# Patient Record
Sex: Female | Born: 1980 | Hispanic: Yes | Marital: Married | Smoking: Never smoker
Health system: Southern US, Community
[De-identification: ages and names within clinical notes are randomized; demographics above are authoritative.]

---

## 2009-10-15 HISTORY — PX: BREAST ENHANCEMENT SURGERY: SHX7

## 2013-08-28 ENCOUNTER — Ambulatory Visit (INDEPENDENT_AMBULATORY_CARE_PROVIDER_SITE_OTHER): Payer: No Typology Code available for payment source | Admitting: *Deleted

## 2013-08-28 ENCOUNTER — Encounter: Payer: Self-pay | Admitting: *Deleted

## 2013-08-28 DIAGNOSIS — Z3201 Encounter for pregnancy test, result positive: Secondary | ICD-10-CM

## 2013-08-28 DIAGNOSIS — Z3401 Encounter for supervision of normal first pregnancy, first trimester: Secondary | ICD-10-CM

## 2013-08-28 LAB — HIV ANTIBODY (ROUTINE TESTING W REFLEX): HIV: NONREACTIVE

## 2013-08-28 LAB — POCT PREGNANCY, URINE: Preg Test, Ur: POSITIVE — AB

## 2013-08-29 LAB — OBSTETRIC PANEL
Antibody Screen: NEGATIVE
Basophils Relative: 0 % (ref 0–1)
Eosinophils Absolute: 0.1 10*3/uL (ref 0.0–0.7)
HCT: 38.2 % (ref 36.0–46.0)
Hemoglobin: 13 g/dL (ref 12.0–15.0)
Lymphocytes Relative: 23 % (ref 12–46)
Lymphs Abs: 1.5 10*3/uL (ref 0.7–4.0)
MCV: 85.3 fL (ref 78.0–100.0)
Monocytes Absolute: 0.5 10*3/uL (ref 0.1–1.0)
Monocytes Relative: 7 % (ref 3–12)
Neutro Abs: 4.6 10*3/uL (ref 1.7–7.7)
Rh Type: POSITIVE
Rubella: 6.31 Index — ABNORMAL HIGH (ref <0.90)
WBC: 6.7 10*3/uL (ref 4.0–10.5)

## 2013-09-01 LAB — HEMOGLOBINOPATHY EVALUATION: Hgb F Quant: 0 % (ref 0.0–2.0)

## 2013-09-23 ENCOUNTER — Ambulatory Visit (INDEPENDENT_AMBULATORY_CARE_PROVIDER_SITE_OTHER): Payer: No Typology Code available for payment source | Admitting: Family

## 2013-09-23 ENCOUNTER — Encounter: Payer: Self-pay | Admitting: Family

## 2013-09-23 VITALS — BP 103/71 | Temp 97.1°F | Ht 65.0 in

## 2013-09-23 DIAGNOSIS — Z23 Encounter for immunization: Secondary | ICD-10-CM

## 2013-09-23 DIAGNOSIS — Z3490 Encounter for supervision of normal pregnancy, unspecified, unspecified trimester: Secondary | ICD-10-CM | POA: Insufficient documentation

## 2013-09-23 DIAGNOSIS — Z3480 Encounter for supervision of other normal pregnancy, unspecified trimester: Secondary | ICD-10-CM

## 2013-09-23 LAB — POCT URINALYSIS DIP (DEVICE)
Bilirubin Urine: NEGATIVE
Glucose, UA: NEGATIVE mg/dL
Ketones, ur: NEGATIVE mg/dL
Nitrite: NEGATIVE
Specific Gravity, Urine: 1.02 (ref 1.005–1.030)
Urobilinogen, UA: 0.2 mg/dL (ref 0.0–1.0)
pH: 6 (ref 5.0–8.0)

## 2013-09-23 LAB — OB RESULTS CONSOLE GC/CHLAMYDIA
CHLAMYDIA, DNA PROBE: NEGATIVE
GC PROBE AMP, GENITAL: NEGATIVE

## 2013-09-23 NOTE — Progress Notes (Signed)
   Subjective:    Nicole Rivas is a G1P0 [redacted]w[redacted]d being seen today for her first obstetrical visit.  Her obstetrical history is insignificant.   Patient does intend to breast feed. Pregnancy history fully reviewed.  Patient reports nausea, no bleeding and no cramping.  Filed Vitals:   09/23/13 0832 09/23/13 0834  BP: 103/71   Temp: 97.1 F (36.2 C)   Height:  5\' 5"  (1.651 m)    HISTORY: OB History  Gravida Para Term Preterm AB SAB TAB Ectopic Multiple Living  1             # Outcome Date GA Lbr Len/2nd Weight Sex Delivery Anes PTL Lv  1 CUR              History reviewed. No pertinent past medical history. Past Surgical History  Procedure Laterality Date  . Breast enhancement surgery  2011   History reviewed. No pertinent family history.   Exam    Exam   BP 103/71  Temp(Src) 97.1 F (36.2 C)  Ht 5\' 5"  (1.651 m)  LMP 06/25/2013 Uterine Size: size is slightly larger than dates  Pelvic Exam:    Perineum: No Hemorrhoids, Normal Perineum   Vulva: normal   Vagina:  normal mucosa, normal discharge, no palpable nodules   pH: Not done   Cervix: no bleeding following Pap, no cervical motion tenderness and no lesions   Adnexa: normal adnexa and no mass, fullness, tenderness   Bony Pelvis: Adequate  System: Breast:  No nipple retraction or dimpling, No nipple discharge or bleeding, No axillary or supraclavicular adenopathy, Normal to palpation without dominant masses   Skin: normal coloration and turgor, no rashes    Neurologic: negative   Extremities: normal strength, tone, and muscle mass   HEENT neck supple with midline trachea and thyroid without masses   Mouth/Teeth mucous membranes moist, pharynx normal without lesions   Neck supple and no masses   Cardiovascular: regular rate and rhythm, no murmurs or gallops   Respiratory:  appears well, vitals normal, no respiratory distress, acyanotic, normal RR, neck free of mass or lymphadenopathy, chest clear, no wheezing,  crepitations, rhonchi, normal symmetric air entry   Abdomen: soft, non-tender; bowel sounds normal; no masses,  no organomegaly   Urinary: urethral meatus normal      Assessment:    Pregnancy: G1P0 There are no active problems to display for this patient.       Plan:     Reviewed initial prenatal labs results. Prenatal vitamins - continue. Problem list reviewed and updated. Flu vaccine today.   Genetic Screening discussed First Screen: ordered.  Ultrasound discussed; fetal survey: order in 5 weeks..  Follow up in 4 weeks.    [facility] 09/23/2013

## 2013-09-23 NOTE — Progress Notes (Signed)
Pulse- 66 New ob packet given  Weight gain of 25-35lbs Flu vaccine consented and info given

## 2013-09-23 NOTE — Patient Instructions (Signed)
Second Trimester of Pregnancy The second trimester is from week 13 through week 28, months 4 through 6. The second trimester is often a time when you feel your best. Your body has also adjusted to being pregnant, and you begin to feel better physically. Usually, morning sickness has lessened or quit completely, you may have more energy, and you may have an increase in appetite. The second trimester is also a time when the fetus is growing rapidly. At the end of the sixth month, the fetus is about 9 inches long and weighs about 1 pounds. You will likely begin to feel the baby move (quickening) between 18 and 20 weeks of the pregnancy. BODY CHANGES Your body goes through many changes during pregnancy. The changes vary from woman to woman.   Your weight will continue to increase. You will notice your lower abdomen bulging out.  You may begin to get stretch marks on your hips, abdomen, and breasts.  You may develop headaches that can be relieved by medicines approved by your caregiver.  You may urinate more often because the fetus is pressing on your bladder.  You may develop or continue to have heartburn as a result of your pregnancy.  You may develop constipation because certain hormones are causing the muscles that push waste through your intestines to slow down.  You may develop hemorrhoids or swollen, bulging veins (varicose veins).  You may have back pain because of the weight gain and pregnancy hormones relaxing your joints between the bones in your pelvis and as a result of a shift in weight and the muscles that support your balance.  Your breasts will continue to grow and be tender.  Your gums may bleed and may be sensitive to brushing and flossing.  Dark spots or blotches (chloasma, mask of pregnancy) may develop on your face. This will likely fade after the baby is born.  A dark line from your belly button to the pubic area (linea nigra) may appear. This will likely fade after the  baby is born. WHAT TO EXPECT AT YOUR PRENATAL VISITS During a routine prenatal visit:  You will be weighed to make sure you and the fetus are growing normally.  Your blood pressure will be taken.  Your abdomen will be measured to track your baby's growth.  The fetal heartbeat will be listened to.  Any test results from the previous visit will be discussed. Your caregiver may ask you:  How you are feeling.  If you are feeling the baby move.  If you have had any abnormal symptoms, such as leaking fluid, bleeding, severe headaches, or abdominal cramping.  If you have any questions. Other tests that may be performed during your second trimester include:  Blood tests that check for:  Low iron levels (anemia).  Gestational diabetes (between 24 and 28 weeks).  Rh antibodies.  Urine tests to check for infections, diabetes, or protein in the urine.  An ultrasound to confirm the proper growth and development of the baby.  An amniocentesis to check for possible genetic problems.  Fetal screens for spina bifida and Down syndrome. HOME CARE INSTRUCTIONS   Avoid all smoking, herbs, alcohol, and unprescribed drugs. These chemicals affect the formation and growth of the baby.  Follow your caregiver's instructions regarding medicine use. There are medicines that are either safe or unsafe to take during pregnancy.  Exercise only as directed by your caregiver. Experiencing uterine cramps is a good sign to stop exercising.  Continue to eat regular,   healthy meals.  Wear a good support bra for breast tenderness.  Do not use hot tubs, steam rooms, or saunas.  Wear your seat belt at all times when driving.  Avoid raw meat, uncooked cheese, cat litter boxes, and soil used by cats. These carry germs that can cause birth defects in the baby.  Take your prenatal vitamins.  Try taking a stool softener (if your caregiver approves) if you develop constipation. Eat more high-fiber foods,  such as fresh vegetables or fruit and whole grains. Drink plenty of fluids to keep your urine clear or pale yellow.  Take warm sitz baths to soothe any pain or discomfort caused by hemorrhoids. Use hemorrhoid cream if your caregiver approves.  If you develop varicose veins, wear support hose. Elevate your feet for 15 minutes, 3 4 times a day. Limit salt in your diet.  Avoid heavy lifting, wear low heel shoes, and practice good posture.  Rest with your legs elevated if you have leg cramps or low back pain.  Visit your dentist if you have not gone yet during your pregnancy. Use a soft toothbrush to brush your teeth and be gentle when you floss.  A sexual relationship may be continued unless your caregiver directs you otherwise.  Continue to go to all your prenatal visits as directed by your caregiver. SEEK MEDICAL CARE IF:   You have dizziness.  You have mild pelvic cramps, pelvic pressure, or nagging pain in the abdominal area.  You have persistent nausea, vomiting, or diarrhea.  You have a bad smelling vaginal discharge.  You have pain with urination. SEEK IMMEDIATE MEDICAL CARE IF:   You have a fever.  You are leaking fluid from your vagina.  You have spotting or bleeding from your vagina.  You have severe abdominal cramping or pain.  You have rapid weight gain or loss.  You have shortness of breath with chest pain.  You notice sudden or extreme swelling of your face, hands, ankles, feet, or legs.  You have not felt your baby move in over an hour.  You have severe headaches that do not go away with medicine.  You have vision changes. Document Released: 09/25/2001 Document Revised: 06/03/2013 Document Reviewed: 12/02/2012 ExitCare Patient Information 2014 Exit[employer], [employer].  

## 2013-09-23 NOTE — Addendum Note (Signed)
Addended by: Sherre Lain A on: 09/23/2013 10:36 AM   Modules accepted: Orders

## 2013-09-24 LAB — PRESCRIPTION MONITORING PROFILE (19 PANEL)
Amphetamine/Meth: NEGATIVE ng/mL
Buprenorphine, Urine: NEGATIVE ng/mL
Cannabinoid Scrn, Ur: NEGATIVE ng/mL
Carisoprodol, Urine: NEGATIVE ng/mL
Creatinine, Urine: 112.28 mg/dL (ref >20.0)
Fentanyl, Ur: NEGATIVE ng/mL
MDMA URINE: NEGATIVE ng/mL
Methadone Screen, Urine: NEGATIVE ng/mL
Oxycodone Screen, Ur: NEGATIVE ng/mL
Phencyclidine, Ur: NEGATIVE ng/mL
Tapentadol, urine: NEGATIVE ng/mL
pH, Initial: 5.9 pH (ref 4.5–8.9)

## 2013-09-25 ENCOUNTER — Encounter: Payer: Self-pay | Admitting: Family

## 2013-09-25 LAB — CULTURE, OB URINE: Organism ID, Bacteria: NO GROWTH

## 2013-09-28 ENCOUNTER — Encounter: Payer: Self-pay | Admitting: Family

## 2013-10-15 NOTE — L&D Delivery Note (Signed)
Delivery Note At 6:05 AM a viable female was delivered via Vaginal, Spontaneous Delivery (Presentation: ; Occiput Anterior).  APGAR: 9, 9; weight pending.   Placenta status: Intact, Spontaneous.  Cord: 3 vessels with the following complications: None.    Anesthesia: Epidural  Episiotomy: None Lacerations: 2nd degree;Perineal Suture Repair: 2.0 vicryl Est. Blood Loss (mL): 300  Mom to postpartum.  Baby to Couplet care / Skin to Skin.  CRESENZO-DISHMAN,FRANCES 04/09/2014, 8:00 AM   Pt pushed about 20 minutes and delivered without complications.  Delayed cord clamping for 3 minutes.  3rd stage actively managed with Pitocin IV, CCT, and maternal pushing effort.

## 2013-10-21 ENCOUNTER — Ambulatory Visit (INDEPENDENT_AMBULATORY_CARE_PROVIDER_SITE_OTHER): Payer: Medicaid Other | Admitting: Family Medicine

## 2013-10-21 ENCOUNTER — Encounter: Payer: Self-pay | Admitting: Family Medicine

## 2013-10-21 VITALS — BP 102/65 | Temp 99.1°F

## 2013-10-21 DIAGNOSIS — Z3480 Encounter for supervision of other normal pregnancy, unspecified trimester: Secondary | ICD-10-CM

## 2013-10-21 DIAGNOSIS — Z3491 Encounter for supervision of normal pregnancy, unspecified, first trimester: Secondary | ICD-10-CM

## 2013-10-21 LAB — POCT URINALYSIS DIP (DEVICE)
Bilirubin Urine: NEGATIVE
Glucose, UA: NEGATIVE mg/dL
HGB URINE DIPSTICK: NEGATIVE
Ketones, ur: NEGATIVE mg/dL
Leukocytes, UA: NEGATIVE
NITRITE: NEGATIVE
PH: 6 (ref 5.0–8.0)
Protein, ur: NEGATIVE mg/dL
Specific Gravity, Urine: 1.025 (ref 1.005–1.030)
Urobilinogen, UA: 0.2 mg/dL (ref 0.0–1.0)

## 2013-10-21 NOTE — Patient Instructions (Signed)
secSegundo trimestre del Psychiatristembarazo  (Second Trimester of Pregnancy) El segundo trimestre del Psychiatristembarazo se extiende desde la semana 13 hasta la semana 28, del 4 al 6 mes. En general, es el momento del embarazo en el que se sentir mejor. Su organismo se ha adaptado a Charity fundraiserestar embarazada y comienza a Diplomatic Services operational officersentirse fsicamente mejor. En general las nuseas matutinas han disminuido o han desaparecido completamente. El segundo trimestre es tambin la poca en la que el feto se desarrolla rpidamente. Hacia el final del sexto mes, el beb mide aproximadamente 9 pulgadas (23 cm) y pesa alrededor de 1  libras (700 g). Es probable que Architectural technologistsienta mover al beb (dar pataditas) entre las 18 y 20 semanas del Psychiatristembarazo.  CAMBIOS CORPORALES  Su organismo atravesar numerosos cambios durante el [place]barazo. Los cambios varan de una mujer a Educational psychologistotra.   Seguir American Standard Companiesaumentando de peso. Notar que la parte baja del abdomen se ensancha.  Podrn aparecer las primeras estras en las caderas, abdomen y [place]amas.  Es posible que sienta cefaleas, que se pueden Paramedicaliviar con los medicamentos que su mdico le autorice a Chemical engineerutilizar.  Tendr necesidad de orinar con ms frecuencia porque el feto est presionando en la vejiga.  Como consecuencia del Psychiatristembarazo, podr sentir Actoracidez estomacal continuamente.  Podr estar constipada ya que ciertas hormonas hacen que los msculos que New York Life Insuranceempujan los desechos a travs de los intestinos trabajen ms lentamente.  Pueden aparecer hemorroides o abultarse las venas (venas varicosas).  El dolor de espalda se debe al aumento de peso y a que las hormonas del Management consultantembarazo relajan las articulaciones entre los huesos de la pelvis y a la modificacin del peso y a los msculos que soportan el equilibrio.  Sus mamas seguirn desarrollndose y estarn ms sensibles.  Las Veterinary surgeonencas pueden sangrar y estar sensibles al cepillado y al hilo dental.  Pueden aparecer en el rostro zonas oscuras o manchas ([place]cloasma, mscara del [place]jeskaembarazo). Esto  desaparece despus del nacimiento del beb.  Es posible que se formeuna lnea oscura desde el ombligo a la zona del pubis (linea nigra). Esto desaparece despus del nacimiento del beb. QU DEBE ESPERAR EN LAS CONSULTAS PRENATALES  Durante una visita prenatal de rutina:   La pesarn para verificar que usted y el feto se encuentran dentro de los lmites normales.  Le tomarn la presin arterial.  Le medirn el abdomen para verificar el desarrollo del beb.  Escucharn los latidos fetales.  Se evaluarn los resultados de los estudios realizados en visitas anteriores. El mdico le preguntar:   Cmo se siente.  Si siente los movimientos del beb.  Si tiene sntomas anormales, como prdida de lquido, Hewittsangrado, dolores de cabeza intensos o clicos abdominales.  Si tiene [place]una duda. Otros estudios que podrn realizarse durante el segundo trimestre son:   Anlisis de sangre para evaluar:  Niveles bajos de hierro (anemia).  Diabetes gestacional (entre las 24 y las 28 [address]).  Anticuerpos Rh.  Anlisis de orina para detectar infecciones, diabetes o protenas en la orina.  Una ecografa para confirmar si el crecimiento y el desarrollo del beb son los Bertsch-Oceanviewadecuados.  Una amniocentesis para diagnosticar posibles problemas genticos.  Estudios del feto para descartar espina bfida y sndrome de Down. INSTRUCCIONES PARA EL CUIDADO EN EL HOGAR   Evite fumar, consumir hierbas, beber alcohol y Chemical engineerutilizar frmacos que no ne hayan recetado. Estas sustancias qumicas afectan la formacin y el desarrollo del beb.  Siga las indicaciones del profesional con respecto a Adult nursecomo tomar los medicamentos. Durante el embarazo, hay  medicamentos que son seguros y otros no lo son.  Realice actividad fsica slo segn las indicaciones del mdico. Sentir clicos uterinos es el mejor signo para detener la actividad fsica.  Contine haciendo comidas regulares y sanas.  Use un sostn que le brinde buen  soporte si sus mamas estn sensibles.  No utilice la baera con agua caliente, baos turcos y saunas.  Colquese el cinturn de seguridad cuando conduzca.  Evite comer carne cruda y el contacto con los utensilios y desperdicios de los gatos. Estos elementos contienen grmenes que pueden causar defectos de nacimiento en el beb.  Tome las vitaminas indicadas para la etapa prenatal.  Pruebe un laxante (si el mdico la autoriza) si tiene constipacin. Consuma ms alimentos ricos en fibra, como vegetales y frutas frescos y cereales enteros. Beba gran cantidad de lquido para mantener la orina de tono claro o amarillo plido.  Tome baos de agua tibia para calmar el dolor o las molestias causadas por las hemorroides. Use una crema para las hemorroides si el mdico la autoriza.  Si tiene venas varicosas, use medias de soporte. Eleve los pies durante 15 minutos, 3 o 4 veces por da. Limite el consumo de sal en su dieta.  Evite levantar objetos pesados, use zapatos de tacones bajos y mantenga una buena postura.  Descanse con las piernas elevadas si tiene calambres o dolor de cintura.  Visite a su dentista si no lo ha hecho durante el embarazo. Use un cepillo de dientes blando para higienizarse los dientes y use suavemente el hilo dental.  Puede continuar su vida sexual siempre que el mdico la autorice.  Concurra a todas las visitas prenatales segn las indicaciones de su mdico. SOLICITE ATENCIN MDICA SI:   Tiene mareos.  Siente clicos leves, presin en la pelvis o dolor persistente en el abdomen.  Tiene nuseas o vmitos o diarrea persistentes.  Observa una secrecin vaginal con mal olor.  Siente dolor al orinar. SOLICITE ATENCIN MDICA DE INMEDIATO SI:   Tiene fiebre.  Pierde lquido por la vagina.  Tiene sangrando o pequeas prdidas vaginales.  Siente dolor intenso o clicos en el abdomen.  Sube o baja de peso rpidamente.  Tiene dificultad para respirar y le duele  pecho.  Sbitamente se le hincha el rostro, las manos, los tobillos, los pies o las piernas de manera extrema.  No ha sentido los movimientos del beb durante una hora.  Siente un dolor de cabeza intenso que no se alivia con medicamentos.  Su visin se modifica. Document Released: 07/11/2005 Document Revised: 06/03/2013 ExitCare Patient Information 2014 ExitCare, LLC.  

## 2013-10-21 NOTE — Progress Notes (Signed)
No FM, no LOF, no VB, no Ctx No complaints.  Nicole ShieldsDanae Rivas is a 33 y.o. G1P0 at [id number] by LMP presents for ROB  Discussed with Patient:  - quad screen today - Patient plans on breast feeding. - Routine precautions discussed (depression, infection s/s).   Patient provided with all pertinent phone numbers for emergencies. - RTC for any VB, regular, painful cramps/ctxs occurring at a rate of >2/10 min, fever (100.5 or higher), n/v/d, any pain that is unresolving or worsening. - RTC in 4 weeks for next appt.  Problems: Patient Active Problem List   Diagnosis Date Noted  . Supervision of normal pregnancy 09/23/2013    To Do: 1. 18 week anatomy scan ordered.  Patient to schedule.  [ ]  Vaccines: Flu:  Tdap:  [ ]  BCM:   Edu: [ ]  PTL precautions; [ ]  BF class; [ ]  childbirth class; [ ]   BF counseling

## 2013-10-21 NOTE — Progress Notes (Signed)
P=68, 

## 2013-10-23 LAB — AFP, QUAD SCREEN
AFP: 19.6 [IU]/mL
Age Alone: 1:540 {titer}
Curr Gest Age: 16.6 wks.days
Down Syndrome Scr Risk Est: 1:20 {titer}
HCG, Total: 46443 m[IU]/mL
INH: 293.3 pg/mL
Interpretation-AFP: POSITIVE — AB
MOM FOR HCG: 2.33
MoM for AFP: 0.53
MoM for INH: 1.76
OPEN SPINA BIFIDA: NEGATIVE
Osb Risk: <1:27300 {titer}
TRI 18 SCR RISK EST: NEGATIVE
UE3 VALUE: 0.5 ng/mL
uE3 Mom: 0.64

## 2013-10-28 ENCOUNTER — Encounter: Payer: Self-pay | Admitting: Family Medicine

## 2013-10-28 ENCOUNTER — Telehealth: Payer: Self-pay | Admitting: Family Medicine

## 2013-10-28 DIAGNOSIS — O280 Abnormal hematological finding on antenatal screening of mother: Secondary | ICD-10-CM

## 2013-10-28 NOTE — Telephone Encounter (Signed)
+   quad screen 1:20 downs. Refer to MFM. Discussed with nursing.

## 2013-10-28 NOTE — Telephone Encounter (Signed)
Called MFM and made appointment for genetic counseling for 10/30/13 at 10 am. They will discuss results and offer additional testing.  Called Nicole Rivas and left a message we are calling with some important information- please call clinic today or tomorrow.

## 2013-10-29 NOTE — Telephone Encounter (Signed)
Called patient and message stated this person is not receiving incoming calls per their request. Will send patient mychart message.

## 2013-10-29 NOTE — Telephone Encounter (Signed)
[facility] heard busy signal.

## 2013-10-29 NOTE — Telephone Encounter (Signed)
Called Nicole Rivas's number again, unable to leave a message. Called her contact person ( mother) who [place]- and will not be back until 11/17/13.  Will give her message to call us.  Already has obfu and ultrasound scheduled on 11/18/13

## 2013-10-29 NOTE — Telephone Encounter (Signed)
Will forward to Dr. Ike Benedom

## 2013-10-30 ENCOUNTER — Ambulatory Visit (HOSPITAL_COMMUNITY): Payer: No Typology Code available for payment source | Attending: Family Medicine

## 2013-11-18 ENCOUNTER — Ambulatory Visit (HOSPITAL_COMMUNITY)
Admission: RE | Admit: 2013-11-18 | Discharge: 2013-11-18 | Disposition: A | Discharge status: Home / Self Care | Payer: No Typology Code available for payment source | Source: Ambulatory Visit | Attending: Family Medicine | Admitting: Family Medicine

## 2013-11-18 ENCOUNTER — Other Ambulatory Visit: Payer: Self-pay | Admitting: Family Medicine

## 2013-11-18 ENCOUNTER — Encounter: Payer: Self-pay | Admitting: Obstetrics and Gynecology

## 2013-11-18 ENCOUNTER — Other Ambulatory Visit: Payer: Self-pay

## 2013-11-18 ENCOUNTER — Ambulatory Visit (INDEPENDENT_AMBULATORY_CARE_PROVIDER_SITE_OTHER): Payer: No Typology Code available for payment source | Admitting: Obstetrics and Gynecology

## 2013-11-18 VITALS — BP 109/71 | HR 79 | Wt 138.0 lb

## 2013-11-18 VITALS — BP 107/69 | Temp 97.6°F | Wt 136.7 lb

## 2013-11-18 DIAGNOSIS — O289 Unspecified abnormal findings on antenatal screening of mother: Secondary | ICD-10-CM | POA: Insufficient documentation

## 2013-11-18 DIAGNOSIS — Z3490 Encounter for supervision of normal pregnancy, unspecified, unspecified trimester: Secondary | ICD-10-CM

## 2013-11-18 DIAGNOSIS — O280 Abnormal hematological finding on antenatal screening of mother: Secondary | ICD-10-CM

## 2013-11-18 DIAGNOSIS — Z363 Encounter for antenatal screening for malformations: Secondary | ICD-10-CM | POA: Insufficient documentation

## 2013-11-18 DIAGNOSIS — Z3491 Encounter for supervision of normal pregnancy, unspecified, first trimester: Secondary | ICD-10-CM

## 2013-11-18 DIAGNOSIS — Z1389 Encounter for screening for other disorder: Secondary | ICD-10-CM | POA: Insufficient documentation

## 2013-11-18 DIAGNOSIS — Z3689 Encounter for other specified antenatal screening: Secondary | ICD-10-CM

## 2013-11-18 DIAGNOSIS — O358XX0 Maternal care for other (suspected) fetal abnormality and damage, not applicable or unspecified: Secondary | ICD-10-CM | POA: Insufficient documentation

## 2013-11-18 DIAGNOSIS — Z3480 Encounter for supervision of other normal pregnancy, unspecified trimester: Secondary | ICD-10-CM

## 2013-11-18 LAB — POCT URINALYSIS DIP (DEVICE)
BILIRUBIN URINE: NEGATIVE
Glucose, UA: NEGATIVE mg/dL
HGB URINE DIPSTICK: NEGATIVE
KETONES UR: NEGATIVE mg/dL
Nitrite: NEGATIVE
PH: 6 (ref 5.0–8.0)
PROTEIN: NEGATIVE mg/dL
SPECIFIC GRAVITY, URINE: 1.015 (ref 1.005–1.030)
Urobilinogen, UA: 0.2 mg/dL (ref 0.0–1.0)

## 2013-11-18 NOTE — Progress Notes (Signed)
p=68

## 2013-11-18 NOTE — Progress Notes (Signed)
Anatomic US done, report pending.  Doing well. No concerns. Works as Diplomatic Services operational officersecretary.

## 2013-11-19 ENCOUNTER — Encounter: Payer: Self-pay | Admitting: Family Medicine

## 2013-11-19 DIAGNOSIS — O289 Unspecified abnormal findings on antenatal screening of mother: Secondary | ICD-10-CM | POA: Insufficient documentation

## 2013-11-27 ENCOUNTER — Telehealth (HOSPITAL_COMMUNITY): Payer: Self-pay | Admitting: Maternal and Fetal Medicine

## 2013-11-27 NOTE — Telephone Encounter (Signed)
Called Nicole Rivas to review the results of her Panorama.  There was no answer and no voicemail.

## 2013-12-16 ENCOUNTER — Ambulatory Visit (INDEPENDENT_AMBULATORY_CARE_PROVIDER_SITE_OTHER): Payer: Medicaid Other | Admitting: Obstetrics and Gynecology

## 2013-12-16 VITALS — BP 107/70 | Temp 98.2°F | Wt 144.6 lb

## 2013-12-16 DIAGNOSIS — Z3480 Encounter for supervision of other normal pregnancy, unspecified trimester: Secondary | ICD-10-CM

## 2013-12-16 DIAGNOSIS — O289 Unspecified abnormal findings on antenatal screening of mother: Secondary | ICD-10-CM

## 2013-12-16 DIAGNOSIS — Z3490 Encounter for supervision of normal pregnancy, unspecified, unspecified trimester: Secondary | ICD-10-CM

## 2013-12-16 LAB — POCT URINALYSIS DIP (DEVICE)
Bilirubin Urine: NEGATIVE
Glucose, UA: NEGATIVE mg/dL
HGB URINE DIPSTICK: NEGATIVE
Ketones, ur: NEGATIVE mg/dL
Leukocytes, UA: NEGATIVE
Nitrite: NEGATIVE
PROTEIN: NEGATIVE mg/dL
Specific Gravity, Urine: 1.025 (ref 1.005–1.030)
UROBILINOGEN UA: 0.2 mg/dL (ref 0.0–1.0)
pH: 6.5 (ref 5.0–8.0)

## 2013-12-16 NOTE — Patient Instructions (Signed)
Hemorragia vaginal durante el embarazo (segundo trimestre)  (Vaginal Bleeding During Pregnancy, Second Trimester)  Durante el embarazo es relativamente frecuente que se presente una pequeña hemorragia (manchas). Esta situación generalmente mejora por sí misma. Hay diversos factores que pueden causar hemorragia o manchas en el embarazo. Algunas manchas pueden estar relacionadas al embarazo y otras no. A veces, la hemorragia es normal y no es un problema. Sin embargo, la hemorragia también puede ser un signo de algo grave. Debe informar a su médico de inmediato si tiene alguna hemorragia vaginal.  Algunas causas posibles de hemorragia vaginal durante el segundo trimestre incluyen:  · Infección, inflamación o crecimientos anormales en el cuello del útero.  · La placenta cubre parcial o completamente la abertura del cuello del útero (placenta previa).  · La placenta puede haberse separado del útero (abrupción placentaria).  · Usted puede estar sufriendo un trabajo de parto prematuro.  · Es probable que el cuello del útero no sea lo suficientemente resistente para mantener un bebé dentro del útero (insuficiencia cervical).  · Se pueden haber desarrollado pequeños quistes en el útero en lugar de tejido de embarazo (embarazo molar).  INSTRUCCIONES PARA EL CUIDADO EN EL HOGAR   Controle su afección para ver si hay cambios. Las siguientes indicaciones ayudarán a aliviar cualquier molestia que pueda sentir:  · Siga las indicaciones del médico para restringir su actividad. Si el médico le indica descanso en la cama, debe quedarse en la cama y levantarse solo para ir al baño. No obstante, el médico puede permitirle que continué con tareas livianas.  · Si es necesario, organícese para que alguien le ayude con las actividades y responsabilidades cotidianas mientras está en cama.  · Lleve un registro de la cantidad y la saturación de las toallas higiénicas que utiliza cada día. Anote este dato.  · No use tampones.No se haga duchas  vaginales.  · No tenga relaciones sexuales u orgasmos hasta que el médico la autorice.  · Si elimina tejido por la vagina, guárdelo para mostrárselo al médico.  · Tome solo medicamentos de venta libre o recetados, según las indicaciones del médico.  · No tome aspirina, ya que puede causar hemorragias.  · No realice ejercicio ni ninguna actividad intensa, ni levante objetos pesados sin el permiso de su médico.  · Cumpla con todas las visitas de control, según le indique su médico.  SOLICITE ATENCIÓN MÉDICA SI:  · Tiene un sangrado vaginal en cualquier momento del embarazo.  · Tiene calambres o dolores de parto.  SOLICITE ATENCIÓN MÉDICA DE INMEDIATO SI:   · Siente calambres intensos en la espalda o en el vientre (abdomen).  · Siente contracciones.  · Tiene fiebre que los medicamentos no pueden controlar.  · Tiene escalofríos.  · Elimina coágulos grandes o tejido por la vagina.  · La hemorragia aumenta.  · Si se siente mareada, débil o se desmaya.  · Tiene una pérdida importante o sale líquido a borbotones por la vagina.  ASEGÚRESE DE QUE:  · Comprende estas instrucciones.  · Controlará su afección.  · Recibirá ayuda de inmediato si no mejora o si empeora.  Document Released: 07/11/2005 Document Revised: 07/22/2013  ExitCare® Patient Information ©2014 ExitCare, LLC.

## 2013-12-16 NOTE — Progress Notes (Signed)
P= 74 Panorama and US results reviewed- low risk and normal anatomy. Encouraged to do childbirth classes, breastfeeding class.

## 2014-01-13 ENCOUNTER — Ambulatory Visit (INDEPENDENT_AMBULATORY_CARE_PROVIDER_SITE_OTHER): Payer: Medicaid Other | Admitting: Advanced Practice Midwife

## 2014-01-13 ENCOUNTER — Encounter: Payer: Self-pay | Admitting: Advanced Practice Midwife

## 2014-01-13 VITALS — BP 97/67 | Temp 97.4°F | Wt 146.9 lb

## 2014-01-13 DIAGNOSIS — O289 Unspecified abnormal findings on antenatal screening of mother: Secondary | ICD-10-CM

## 2014-01-13 DIAGNOSIS — Z3480 Encounter for supervision of other normal pregnancy, unspecified trimester: Secondary | ICD-10-CM

## 2014-01-13 DIAGNOSIS — Z3490 Encounter for supervision of normal pregnancy, unspecified, unspecified trimester: Secondary | ICD-10-CM

## 2014-01-13 LAB — POCT URINALYSIS DIP (DEVICE)
BILIRUBIN URINE: NEGATIVE
GLUCOSE, UA: NEGATIVE mg/dL
HGB URINE DIPSTICK: NEGATIVE
KETONES UR: NEGATIVE mg/dL
Nitrite: NEGATIVE
Protein, ur: 30 mg/dL — AB
SPECIFIC GRAVITY, URINE: 1.025 (ref 1.005–1.030)
UROBILINOGEN UA: 0.2 mg/dL (ref 0.0–1.0)
pH: 6.5 (ref 5.0–8.0)

## 2014-01-13 LAB — RPR

## 2014-01-13 LAB — CBC
HEMATOCRIT: 34.7 % — AB (ref 36.0–46.0)
Hemoglobin: 11.8 g/dL — ABNORMAL LOW (ref 12.0–15.0)
MCH: 29.8 pg (ref 26.0–34.0)
MCHC: 34 g/dL (ref 30.0–36.0)
MCV: 87.6 fL (ref 78.0–100.0)
Platelets: 234 10*3/uL (ref 150–400)
RBC: 3.96 MIL/uL (ref 3.87–5.11)
RDW: 13.1 % (ref 11.5–15.5)
WBC: 8.6 10*3/uL (ref 4.0–10.5)

## 2014-01-13 LAB — HIV ANTIBODY (ROUTINE TESTING W REFLEX): HIV: NONREACTIVE

## 2014-01-13 MED ORDER — TETANUS-DIPHTH-ACELL PERTUSSIS 5-2.5-18.5 LF-MCG/0.5 IM SUSP: 0.5000 mL | Freq: Once | INTRAMUSCULAR | Status: DC

## 2014-01-13 NOTE — Patient Instructions (Signed)
Third Trimester of Pregnancy  The third trimester is from week 29 through week 42, months 7 through 9. The third trimester is a time when the fetus is growing rapidly. At the end of the ninth month, the fetus is about 20 inches in length and weighs 6 10 pounds.   BODY CHANGES  Your body goes through many changes during pregnancy. The changes vary from woman to woman.    Your weight will continue to increase. You can expect to gain 25 35 pounds (11 16 kg) by the end of the pregnancy.   You may begin to get stretch marks on your hips, abdomen, and breasts.   You may urinate more often because the fetus is moving lower into your pelvis and pressing on your bladder.   You may develop or continue to have heartburn as a result of your pregnancy.   You may develop constipation because certain hormones are causing the muscles that push waste through your intestines to slow down.   You may develop hemorrhoids or swollen, bulging veins (varicose veins).   You may have pelvic pain because of the weight gain and pregnancy hormones relaxing your joints between the bones in your pelvis. Back aches may result from over exertion of the muscles supporting your posture.   Your breasts will continue to grow and be tender. A yellow discharge may leak from your breasts called colostrum.   Your belly button may stick out.   You may feel short of breath because of your expanding uterus.   You may notice the fetus "dropping," or moving lower in your abdomen.   You may have a bloody mucus discharge. This usually occurs a few days to a week before labor begins.   Your cervix becomes thin and soft (effaced) near your due date.  WHAT TO EXPECT AT YOUR PRENATAL EXAMS   You will have prenatal exams every 2 weeks until week 36. Then, you will have weekly prenatal exams. During a routine prenatal visit:   You will be weighed to make sure you and the fetus are growing normally.   Your blood pressure is taken.   Your abdomen will be  measured to track your baby's growth.   The fetal heartbeat will be listened to.   Any test results from the previous visit will be discussed.   You may have a cervical check near your due date to see if you have effaced.  At around 36 weeks, your caregiver will check your cervix. At the same time, your caregiver will also perform a test on the secretions of the vaginal tissue. This test is to determine if a type of bacteria, Group B streptococcus, is present. Your caregiver will explain this further.  Your caregiver may ask you:   What your birth plan is.   How you are feeling.   If you are feeling the baby move.   If you have had any abnormal symptoms, such as leaking fluid, bleeding, severe headaches, or abdominal cramping.   If you have any questions.  Other tests or screenings that may be performed during your third trimester include:   Blood tests that check for low iron levels (anemia).   Fetal testing to check the health, activity level, and growth of the fetus. Testing is done if you have certain medical conditions or if there are problems during the pregnancy.  FALSE LABOR  You may feel small, irregular contractions that eventually go away. These are called Braxton Hicks contractions, or   false labor. Contractions may last for hours, days, or even weeks before true labor sets in. If contractions come at regular intervals, intensify, or become painful, it is best to be seen by your caregiver.   SIGNS OF LABOR    Menstrual-like cramps.   Contractions that are 5 minutes apart or less.   Contractions that start on the top of the uterus and spread down to the lower abdomen and back.   A sense of increased pelvic pressure or back pain.   A watery or bloody mucus discharge that comes from the vagina.  If you have any of these signs before the 37th week of pregnancy, call your caregiver right away. You need to go to the hospital to get checked immediately.  HOME CARE INSTRUCTIONS    Avoid all  smoking, herbs, alcohol, and unprescribed drugs. These chemicals affect the formation and growth of the baby.   Follow your caregiver's instructions regarding medicine use. There are medicines that are either safe or unsafe to take during pregnancy.   Exercise only as directed by your caregiver. Experiencing uterine cramps is a good sign to stop exercising.   Continue to eat regular, healthy meals.   Wear a good support bra for breast tenderness.   Do not use hot tubs, steam rooms, or saunas.   Wear your seat belt at all times when driving.   Avoid raw meat, uncooked cheese, cat litter boxes, and soil used by cats. These carry germs that can cause birth defects in the baby.   Take your prenatal vitamins.   Try taking a stool softener (if your caregiver approves) if you develop constipation. Eat more high-fiber foods, such as fresh vegetables or fruit and whole grains. Drink plenty of fluids to keep your urine clear or pale yellow.   Take warm sitz baths to soothe any pain or discomfort caused by hemorrhoids. Use hemorrhoid cream if your caregiver approves.   If you develop varicose veins, wear support hose. Elevate your feet for 15 minutes, 3 4 times a day. Limit salt in your diet.   Avoid heavy lifting, wear low heal shoes, and practice good posture.   Rest a lot with your legs elevated if you have leg cramps or low back pain.   Visit your dentist if you have not gone during your pregnancy. Use a soft toothbrush to brush your teeth and be gentle when you floss.   A sexual relationship may be continued unless your caregiver directs you otherwise.   Do not travel far distances unless it is absolutely necessary and only with the approval of your caregiver.   Take prenatal classes to understand, practice, and ask questions about the labor and delivery.   Make a trial run to the hospital.   Pack your hospital bag.   Prepare the baby's nursery.   Continue to go to all your prenatal visits as directed  by your caregiver.  SEEK MEDICAL CARE IF:   You are unsure if you are in labor or if your water has broken.   You have dizziness.   You have mild pelvic cramps, pelvic pressure, or nagging pain in your abdominal area.   You have persistent nausea, vomiting, or diarrhea.   You have a bad smelling vaginal discharge.   You have pain with urination.  SEEK IMMEDIATE MEDICAL CARE IF:    You have a fever.   You are leaking fluid from your vagina.   You have spotting or bleeding from your vagina.     You have severe abdominal cramping or pain.   You have rapid weight loss or gain.   You have shortness of breath with chest pain.   You notice sudden or extreme swelling of your face, hands, ankles, feet, or legs.   You have not felt your baby move in over an hour.   You have severe headaches that do not go away with medicine.   You have vision changes.  Document Released: 09/25/2001 Document Revised: 06/03/2013 Document Reviewed: 12/02/2012  [employer] Patient Information 2014 [employer].

## 2014-01-13 NOTE — Progress Notes (Signed)
Discussed normal Panorama results.

## 2014-01-13 NOTE — Progress Notes (Signed)
Pulse: 69

## 2014-01-14 LAB — GLUCOSE TOLERANCE, 1 HOUR (50G) W/O FASTING: GLUCOSE 1 HOUR GTT: 153 mg/dL — AB (ref 70–140)

## 2014-01-15 ENCOUNTER — Encounter: Payer: Self-pay | Admitting: Advanced Practice Midwife

## 2014-01-18 ENCOUNTER — Telehealth: Payer: Self-pay

## 2014-01-18 NOTE — Telephone Encounter (Signed)
Message copied by Louanna RawAMPBELL, Festus Pursel M on Mon Jan 18, 2014  3:02 PM ------      Message from: SutcliffeWILLIAMS, [place] L      Created: Fri Jan 15, 2014  7:10 PM      Regarding: needs 3 hr tst       Glucola 153      Needs 3 hr            Thanks      Hilda LiasMarie ------

## 2014-01-18 NOTE — Telephone Encounter (Signed)
Attempted to call pt. No voicemail. Unable to leave message.

## 2014-01-19 NOTE — Telephone Encounter (Signed)
Called pt. No answer. Able to leave message. Left message stating we are calling with results and of the need to schedule another appointment please call clinic at your earliest convenience.

## 2014-01-19 NOTE — Telephone Encounter (Signed)
Called pt with Spanish interpreter, Nicole Rivas, and left message stating that she has OB appt scheduled on 01/27/14 @ 0800 in which she will come in fasting after midnight the night before and that she will be here at the faciltiy for 3 hours; if she has any questions to call the clinics. A certified letter will also be sent.

## 2014-01-26 ENCOUNTER — Telehealth: Payer: Self-pay | Admitting: Obstetrics & Gynecology

## 2014-01-26 NOTE — Telephone Encounter (Signed)
Patient [place] and stated [place]th. With Interpreter Nettie ElmSylvia rescheduled her appointment, and asked her if she saw anyone while there to bring paperwork with her.

## 2014-01-27 ENCOUNTER — Encounter: Payer: Medicaid Other | Admitting: Family Medicine

## 2014-02-19 ENCOUNTER — Ambulatory Visit (INDEPENDENT_AMBULATORY_CARE_PROVIDER_SITE_OTHER): Payer: Medicaid Other | Admitting: Advanced Practice Midwife

## 2014-02-19 VITALS — BP 102/70 | HR 72 | Temp 97.8°F | Wt 148.7 lb

## 2014-02-19 DIAGNOSIS — Z3490 Encounter for supervision of normal pregnancy, unspecified, unspecified trimester: Secondary | ICD-10-CM

## 2014-02-19 DIAGNOSIS — Z3480 Encounter for supervision of other normal pregnancy, unspecified trimester: Secondary | ICD-10-CM

## 2014-02-19 LAB — POCT URINALYSIS DIP (DEVICE)
Bilirubin Urine: NEGATIVE
GLUCOSE, UA: NEGATIVE mg/dL
Hgb urine dipstick: NEGATIVE
KETONES UR: NEGATIVE mg/dL
NITRITE: NEGATIVE
Protein, ur: NEGATIVE mg/dL
Specific Gravity, Urine: 1.02 (ref 1.005–1.030)
Urobilinogen, UA: 0.2 mg/dL (ref 0.0–1.0)
pH: 7 (ref 5.0–8.0)

## 2014-02-19 NOTE — Progress Notes (Signed)
Doing well.  Good fetal movement, denies vaginal bleeding, LOF, regular contractions.  Reports sharp LLQ pain with movement x3 days.  Discussed round ligament pain, recommend rest/ice/heat/warm bath/ Tylenol for pain.  Needs 3 hour glucose--reschedule for next week.

## 2014-02-19 NOTE — Progress Notes (Signed)
C/o pain LLQ 3 days ago.

## 2014-03-05 ENCOUNTER — Ambulatory Visit (INDEPENDENT_AMBULATORY_CARE_PROVIDER_SITE_OTHER): Payer: Medicaid Other | Admitting: Obstetrics & Gynecology

## 2014-03-05 VITALS — BP 98/71 | HR 80 | Temp 97.0°F | Wt 152.3 lb

## 2014-03-05 DIAGNOSIS — Z3480 Encounter for supervision of other normal pregnancy, unspecified trimester: Secondary | ICD-10-CM

## 2014-03-05 DIAGNOSIS — Z3490 Encounter for supervision of normal pregnancy, unspecified, unspecified trimester: Secondary | ICD-10-CM

## 2014-03-05 LAB — OB RESULTS CONSOLE GBS: STREP GROUP B AG: NEGATIVE

## 2014-03-05 LAB — POCT URINALYSIS DIP (DEVICE)
Bilirubin Urine: NEGATIVE
GLUCOSE, UA: NEGATIVE mg/dL
Hgb urine dipstick: NEGATIVE
Ketones, ur: NEGATIVE mg/dL
Nitrite: NEGATIVE
PROTEIN: NEGATIVE mg/dL
Specific Gravity, Urine: 1.02 (ref 1.005–1.030)
UROBILINOGEN UA: 0.2 mg/dL (ref 0.0–1.0)
pH: 6.5 (ref 5.0–8.0)

## 2014-03-05 NOTE — Progress Notes (Signed)
Still has not done her 3 hr GTT for abnormal 1 hr GTT of 153; patient will schedule appointment. Already ate this morning which precludes a fasting test.  Pelvic cultures done today.  No other complaints or concerns.  Routine obstetric precautions reviewed.

## 2014-03-05 NOTE — Patient Instructions (Signed)
Return to clinic for any obstetric concerns or go to [facility] for evaluation  

## 2014-03-05 NOTE — Progress Notes (Signed)
Patient reports some groin pain  

## 2014-03-06 LAB — GC/CHLAMYDIA PROBE AMP
CT PROBE, AMP APTIMA: NEGATIVE
GC Probe RNA: NEGATIVE

## 2014-03-07 LAB — CULTURE, STREPTOCOCCUS GRP B W/SUSCEPT

## 2014-03-08 ENCOUNTER — Encounter: Payer: Self-pay | Admitting: Obstetrics & Gynecology

## 2014-03-10 ENCOUNTER — Encounter: Payer: Self-pay | Admitting: General Practice

## 2014-03-11 ENCOUNTER — Ambulatory Visit (INDEPENDENT_AMBULATORY_CARE_PROVIDER_SITE_OTHER): Payer: Medicaid Other | Admitting: Obstetrics & Gynecology

## 2014-03-11 VITALS — BP 108/75 | HR 67 | Temp 97.1°F | Wt 154.0 lb

## 2014-03-11 DIAGNOSIS — R7309 Other abnormal glucose: Secondary | ICD-10-CM

## 2014-03-11 DIAGNOSIS — Z3490 Encounter for supervision of normal pregnancy, unspecified, unspecified trimester: Secondary | ICD-10-CM

## 2014-03-11 DIAGNOSIS — O289 Unspecified abnormal findings on antenatal screening of mother: Secondary | ICD-10-CM

## 2014-03-11 DIAGNOSIS — Z23 Encounter for immunization: Secondary | ICD-10-CM

## 2014-03-11 DIAGNOSIS — Z3480 Encounter for supervision of other normal pregnancy, unspecified trimester: Secondary | ICD-10-CM

## 2014-03-11 LAB — POCT URINALYSIS DIP (DEVICE)
BILIRUBIN URINE: NEGATIVE
GLUCOSE, UA: NEGATIVE mg/dL
Hgb urine dipstick: NEGATIVE
Ketones, ur: NEGATIVE mg/dL
NITRITE: NEGATIVE
Protein, ur: 30 mg/dL — AB
Specific Gravity, Urine: 1.02 (ref 1.005–1.030)
UROBILINOGEN UA: 1 mg/dL (ref 0.0–1.0)
pH: 7 (ref 5.0–8.0)

## 2014-03-11 NOTE — Progress Notes (Signed)
No problems.  Wants Tdap today.  Pt getting 3 hour GTT (unsure in why the delay).

## 2014-03-11 NOTE — Progress Notes (Signed)
Pain-pelvic 

## 2014-03-12 ENCOUNTER — Encounter: Payer: Self-pay | Admitting: General Practice

## 2014-03-12 LAB — GLUCOSE TOLERANCE, 3 HOURS
GLUCOSE, 1 HOUR-GESTATIONAL: 176 mg/dL (ref 70–189)
GLUCOSE, 2 HOUR-GESTATIONAL: 165 mg/dL — AB (ref 70–164)
Glucose Tolerance, Fasting: 66 mg/dL — ABNORMAL LOW (ref 70–104)
Glucose, GTT - 3 Hour: 73 mg/dL (ref 70–144)

## 2014-03-15 ENCOUNTER — Encounter (HOSPITAL_COMMUNITY): Payer: Self-pay | Admitting: Obstetrics & Gynecology

## 2014-03-17 ENCOUNTER — Encounter: Payer: Self-pay | Admitting: Advanced Practice Midwife

## 2014-03-17 ENCOUNTER — Ambulatory Visit (INDEPENDENT_AMBULATORY_CARE_PROVIDER_SITE_OTHER): Payer: Medicaid Other | Admitting: Advanced Practice Midwife

## 2014-03-17 VITALS — BP 113/74 | HR 74 | Wt 154.9 lb

## 2014-03-17 DIAGNOSIS — O289 Unspecified abnormal findings on antenatal screening of mother: Secondary | ICD-10-CM

## 2014-03-17 NOTE — Progress Notes (Signed)
GBS and cultures negative. Encouraged to choose a pediatrician. She thought we would assign her one.                3 hr GTT = [phone] (one value at 2 hrs elevated)

## 2014-03-17 NOTE — Patient Instructions (Signed)

## 2014-03-18 LAB — POCT URINALYSIS DIP (DEVICE)
BILIRUBIN URINE: NEGATIVE
Glucose, UA: NEGATIVE mg/dL
HGB URINE DIPSTICK: NEGATIVE
Ketones, ur: NEGATIVE mg/dL
Nitrite: NEGATIVE
PH: 7 (ref 5.0–8.0)
PROTEIN: NEGATIVE mg/dL
Specific Gravity, Urine: 1.02 (ref 1.005–1.030)
UROBILINOGEN UA: 0.2 mg/dL (ref 0.0–1.0)

## 2014-03-24 ENCOUNTER — Ambulatory Visit (INDEPENDENT_AMBULATORY_CARE_PROVIDER_SITE_OTHER): Payer: Medicaid Other | Admitting: Advanced Practice Midwife

## 2014-03-24 VITALS — BP 110/68 | HR 82 | Wt 155.1 lb

## 2014-03-24 DIAGNOSIS — O289 Unspecified abnormal findings on antenatal screening of mother: Secondary | ICD-10-CM

## 2014-03-24 LAB — POCT URINALYSIS DIP (DEVICE)
Bilirubin Urine: NEGATIVE
Glucose, UA: NEGATIVE mg/dL
Hgb urine dipstick: NEGATIVE
Ketones, ur: NEGATIVE mg/dL
NITRITE: NEGATIVE
PH: 7 (ref 5.0–8.0)
Protein, ur: NEGATIVE mg/dL
Specific Gravity, Urine: 1.02 (ref 1.005–1.030)
UROBILINOGEN UA: 0.2 mg/dL (ref 0.0–1.0)

## 2014-03-24 NOTE — Patient Instructions (Signed)

## 2014-03-24 NOTE — Progress Notes (Signed)
Doing well. Has not chosen peds yet. [place] Medicaid office told her to wait until after baby born. I told her to go ahead and pick one out

## 2014-03-31 ENCOUNTER — Ambulatory Visit (INDEPENDENT_AMBULATORY_CARE_PROVIDER_SITE_OTHER): Payer: Medicaid Other | Admitting: Obstetrics and Gynecology

## 2014-03-31 ENCOUNTER — Encounter: Payer: Self-pay | Admitting: Obstetrics and Gynecology

## 2014-03-31 VITALS — BP 113/79 | HR 68 | Temp 98.6°F | Wt 156.0 lb

## 2014-03-31 DIAGNOSIS — Z3490 Encounter for supervision of normal pregnancy, unspecified, unspecified trimester: Secondary | ICD-10-CM

## 2014-03-31 DIAGNOSIS — Z3480 Encounter for supervision of other normal pregnancy, unspecified trimester: Secondary | ICD-10-CM

## 2014-03-31 LAB — POCT URINALYSIS DIP (DEVICE)
Bilirubin Urine: NEGATIVE
GLUCOSE, UA: NEGATIVE mg/dL
Hgb urine dipstick: NEGATIVE
Ketones, ur: NEGATIVE mg/dL
NITRITE: NEGATIVE
PROTEIN: NEGATIVE mg/dL
SPECIFIC GRAVITY, URINE: 1.02 (ref 1.005–1.030)
UROBILINOGEN UA: 0.2 mg/dL (ref 0.0–1.0)
pH: 6 (ref 5.0–8.0)

## 2014-03-31 NOTE — Patient Instructions (Signed)
Fetal Movement Counts °Patient Name: __________________________________________________ Patient Due Date: ____________________ °Performing a fetal movement count is highly recommended in high-risk pregnancies, but it is good for every pregnant woman to do. Your caregiver may ask you to start counting fetal movements at 28 weeks of the pregnancy. Fetal movements often increase: °· After eating a full meal. °· After physical activity. °· After eating or drinking something sweet or cold. °· At rest. °Pay attention to when you feel the baby is most active. This will help you notice a pattern of your baby's sleep and wake cycles and what factors contribute to an increase in fetal movement. It is important to perform a fetal movement count at the same time each day when your baby is normally most active.  °HOW TO COUNT FETAL MOVEMENTS °1. Find a quiet and comfortable area to sit or lie down on your left side. Lying on your left side provides the best blood and oxygen circulation to your baby. °2. Write down the day and time on a sheet of paper or in a journal. °3. Start counting kicks, flutters, swishes, rolls, or jabs in a 2 hour period. You should feel at least 10 movements within 2 hours. °4. If you do not feel 10 movements in 2 hours, wait 2-3 hours and count again. Look for a change in the pattern or not enough counts in 2 hours. °SEEK MEDICAL CARE IF: °· You feel less than 10 counts in 2 hours, tried twice. °· There is no movement in over an hour. °· The pattern is changing or taking longer each day to reach 10 counts in 2 hours. °· You feel the baby is not moving as he or she usually does. °Date: ____________ Movements: ____________ Start time: ____________ Finish time: ____________  °Date: ____________ Movements: ____________ Start time: ____________ Finish time: ____________ °Date: ____________ Movements: ____________ Start time: ____________ Finish time: ____________ °Date: ____________ Movements: ____________  Start time: ____________ Finish time: ____________ °Date: ____________ Movements: ____________ Start time: ____________ Finish time: ____________ °Date: ____________ Movements: ____________ Start time: ____________ Finish time: ____________ °Date: ____________ Movements: ____________ Start time: ____________ Finish time: ____________ °Date: ____________ Movements: ____________ Start time: ____________ Finish time: ____________  °Date: ____________ Movements: ____________ Start time: ____________ Finish time: ____________ °Date: ____________ Movements: ____________ Start time: ____________ Finish time: ____________ °Date: ____________ Movements: ____________ Start time: ____________ Finish time: ____________ °Date: ____________ Movements: ____________ Start time: ____________ Finish time: ____________ °Date: ____________ Movements: ____________ Start time: ____________ Finish time: ____________ °Date: ____________ Movements: ____________ Start time: ____________ Finish time: ____________ °Date: ____________ Movements: ____________ Start time: ____________ Finish time: ____________  °Date: ____________ Movements: ____________ Start time: ____________ Finish time: ____________ °Date: ____________ Movements: ____________ Start time: ____________ Finish time: ____________ °Date: ____________ Movements: ____________ Start time: ____________ Finish time: ____________ °Date: ____________ Movements: ____________ Start time: ____________ Finish time: ____________ °Date: ____________ Movements: ____________ Start time: ____________ Finish time: ____________ °Date: ____________ Movements: ____________ Start time: ____________ Finish time: ____________ °Date: ____________ Movements: ____________ Start time: ____________ Finish time: ____________  °Date: ____________ Movements: ____________ Start time: ____________ Finish time: ____________ °Date: ____________ Movements: ____________ Start time: ____________ Finish time:  ____________ °Date: ____________ Movements: ____________ Start time: ____________ Finish time: ____________ °Date: ____________ Movements: ____________ Start time: ____________ Finish time: ____________ °Date: ____________ Movements: ____________ Start time: ____________ Finish time: ____________ °Date: ____________ Movements: ____________ Start time: ____________ Finish time: ____________ °Date: ____________ Movements: ____________ Start time: ____________ Finish time: ____________  °Date: ____________ Movements: ____________ Start time: ____________ Finish time:   ____________ °Date: ____________ Movements: ____________ Start time: ____________ Finish time: ____________ °Date: ____________ Movements: ____________ Start time: ____________ Finish time: ____________ °Date: ____________ Movements: ____________ Start time: ____________ Finish time: ____________ °Date: ____________ Movements: ____________ Start time: ____________ Finish time: ____________ °Date: ____________ Movements: ____________ Start time: ____________ Finish time: ____________ °Date: ____________ Movements: ____________ Start time: ____________ Finish time: ____________  °Date: ____________ Movements: ____________ Start time: ____________ Finish time: ____________ °Date: ____________ Movements: ____________ Start time: ____________ Finish time: ____________ °Date: ____________ Movements: ____________ Start time: ____________ Finish time: ____________ °Date: ____________ Movements: ____________ Start time: ____________ Finish time: ____________ °Date: ____________ Movements: ____________ Start time: ____________ Finish time: ____________ °Date: ____________ Movements: ____________ Start time: ____________ Finish time: ____________ °Date: ____________ Movements: ____________ Start time: ____________ Finish time: ____________  °Date: ____________ Movements: ____________ Start time: ____________ Finish time: ____________ °Date: ____________ Movements:  ____________ Start time: ____________ Finish time: ____________ °Date: ____________ Movements: ____________ Start time: ____________ Finish time: ____________ °Date: ____________ Movements: ____________ Start time: ____________ Finish time: ____________ °Date: ____________ Movements: ____________ Start time: ____________ Finish time: ____________ °Date: ____________ Movements: ____________ Start time: ____________ Finish time: ____________ °Date: ____________ Movements: ____________ Start time: ____________ Finish time: ____________  °Date: ____________ Movements: ____________ Start time: ____________ Finish time: ____________ °Date: ____________ Movements: ____________ Start time: ____________ Finish time: ____________ °Date: ____________ Movements: ____________ Start time: ____________ Finish time: ____________ °Date: ____________ Movements: ____________ Start time: ____________ Finish time: ____________ °Date: ____________ Movements: ____________ Start time: ____________ Finish time: ____________ °Date: ____________ Movements: ____________ Start time: ____________ Finish time: ____________ °Document Released: 10/31/2006 Document Revised: 09/17/2012 Document Reviewed: 07/28/2012 °[employer]® Patient Information ©2015 [employer]. This information is not intended to replace advice given to you by your health care provider. Make sure you discuss any questions you have with your health care provider. ° °

## 2014-03-31 NOTE — Progress Notes (Signed)
Good FM. No sx labor yet. Return Mon for NST

## 2014-04-01 ENCOUNTER — Inpatient Hospital Stay (HOSPITAL_COMMUNITY)
Admission: AD | Admit: 2014-04-01 | Payer: Medicaid Other | Source: Ambulatory Visit | Admitting: Obstetrics & Gynecology

## 2014-04-03 IMAGING — US US OB DETAIL+14 WK
1 series · 12 of 28 positions shown · non-contrast
Comparison: none

[Series 1: us ob detail+14 wk · 0.06mm/px · 12 of 76 slices shown]
[im 3/76]
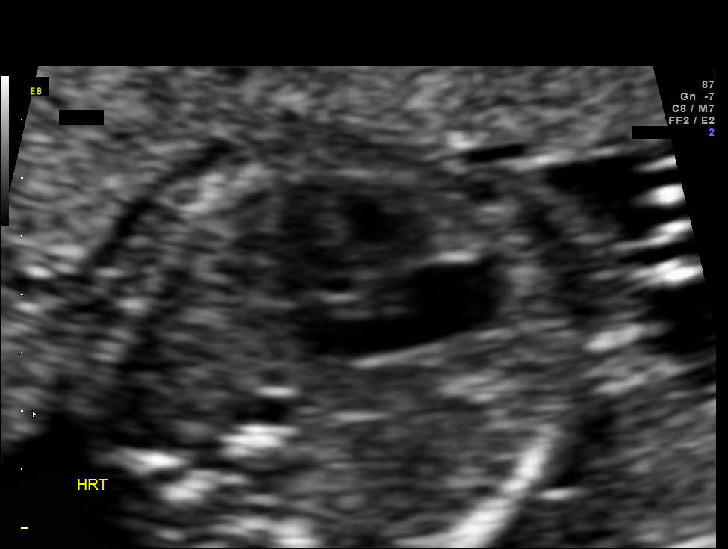
[im 9/76]
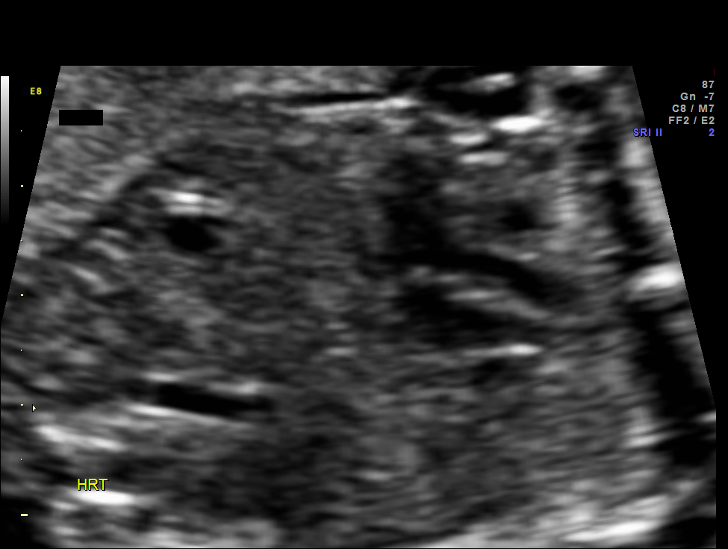
[im 14/76]
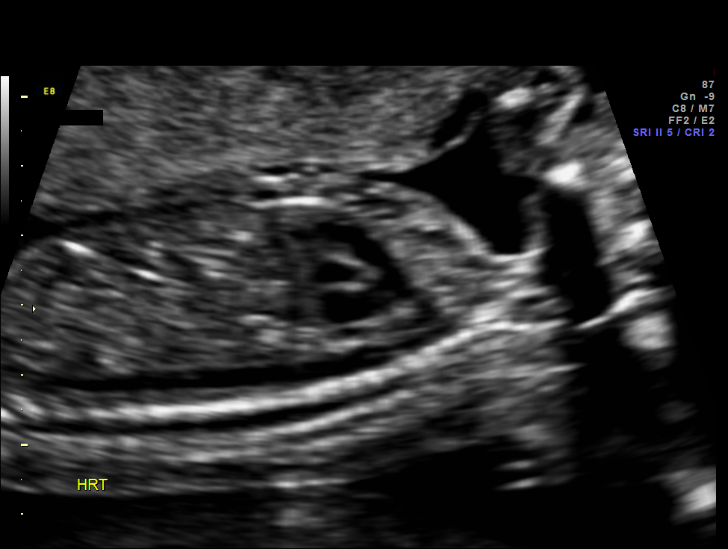
[im 23/76]
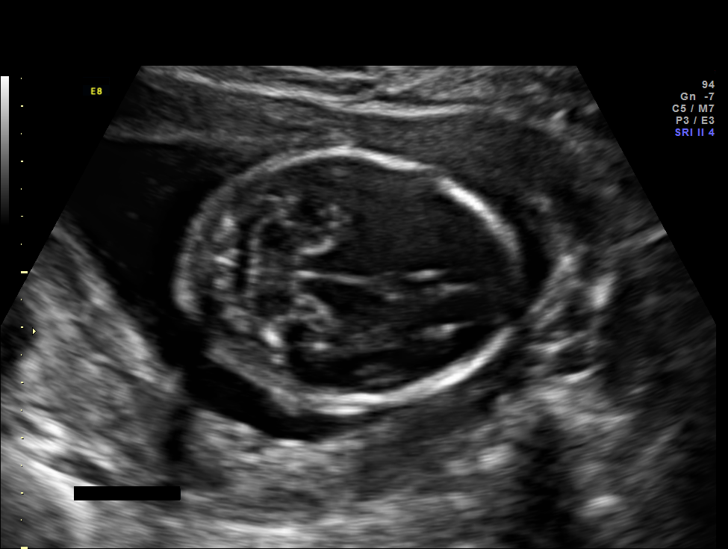
[im 28/76]
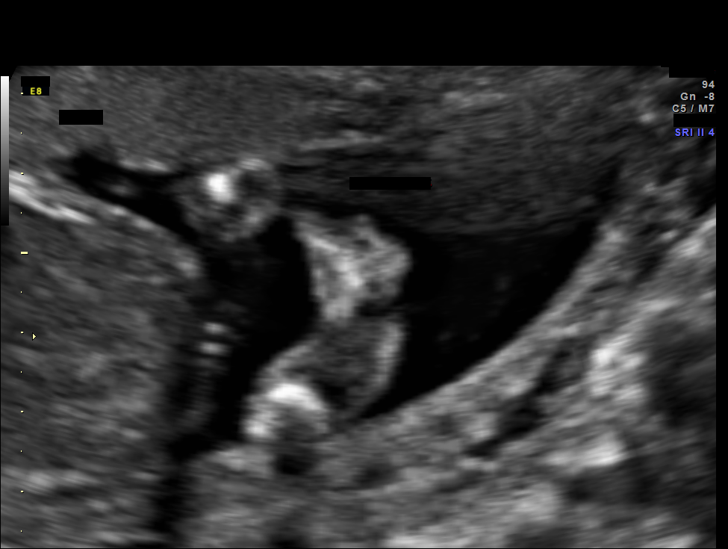
[im 34/76]
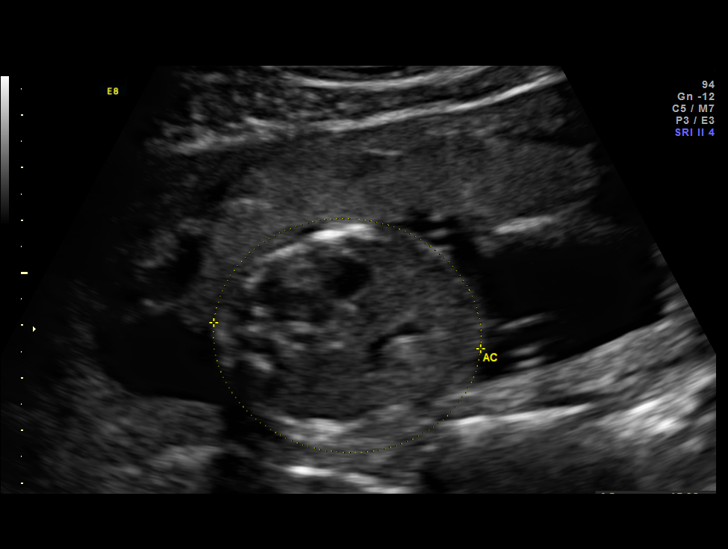
[im 42/76]
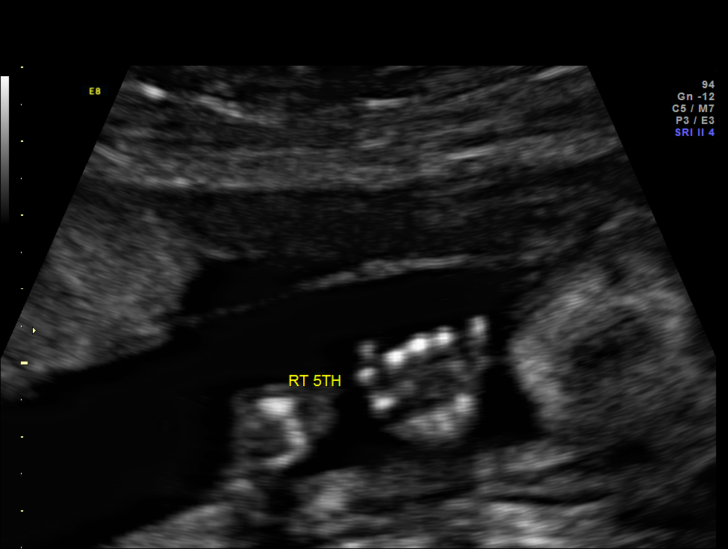
[im 48/76]
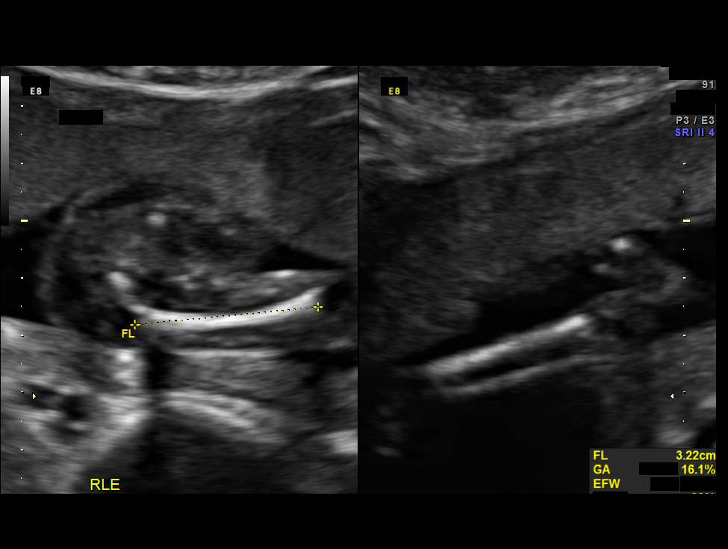
[im 53/76]
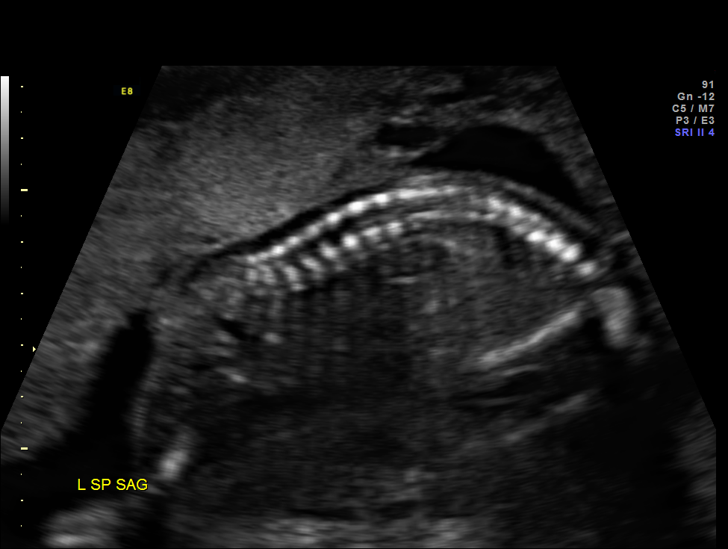
[im 62/76]
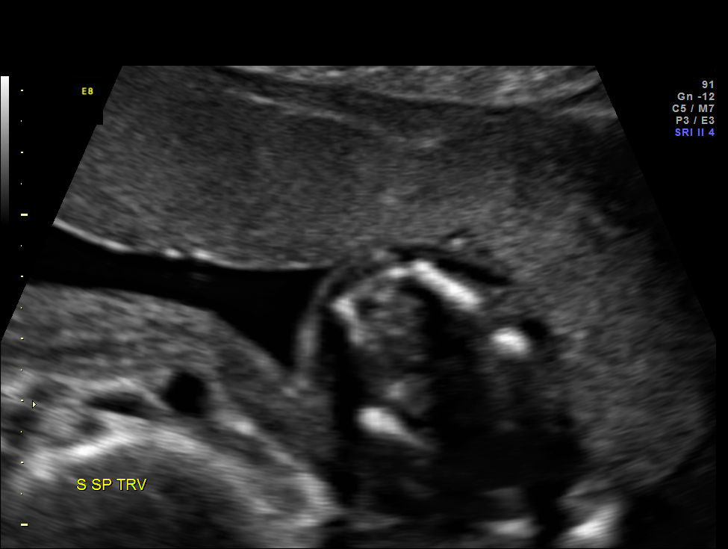
[im 67/76]
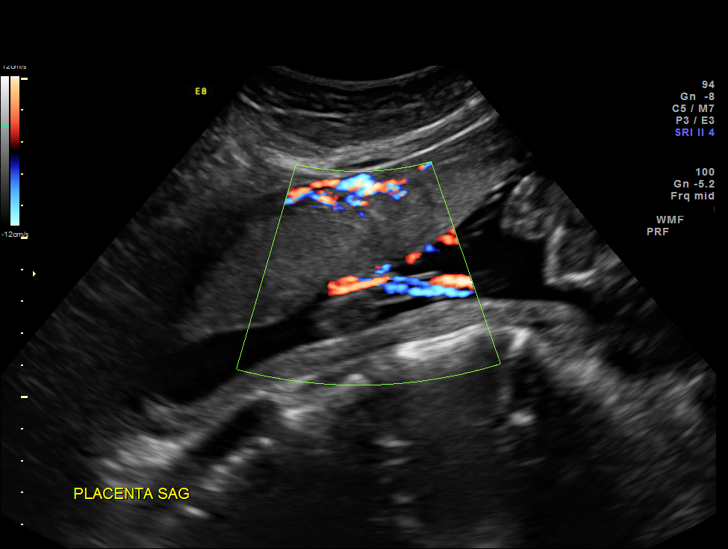
[im 73/76]
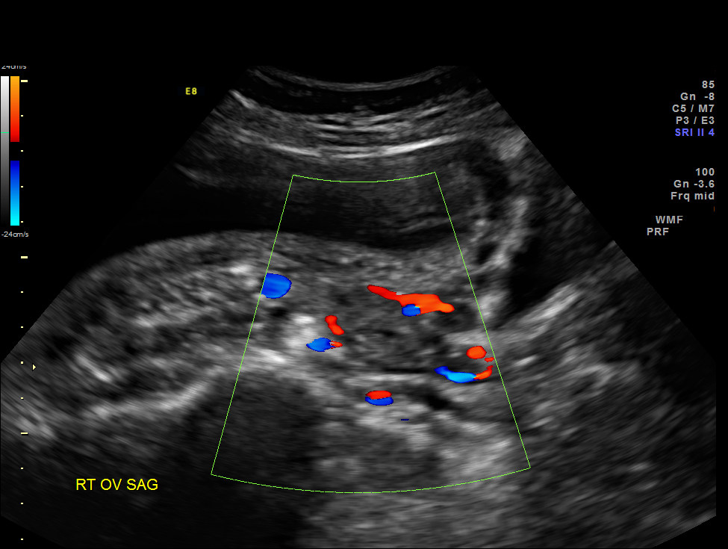

[12 of 28 positions shown; findings below may reference images not displayed]

OBSTETRICS REPORT
                      (Signed Final 11/18/2013 [DATE])

Service(s) Provided

 US OB DETAIL + 14 WK                                  76811.0
Indications

 Detailed fetal anatomic survey
 Abnormal biochemical screen (quad) for Trisomy
 21 (DSR [DATE])
Fetal Evaluation

 Num Of Fetuses:    1
 Fetal Heart Rate:  136                          bpm
 Cardiac Activity:  Observed
 Presentation:      Cephalic
 Placenta:          Anterior, above cervical os
 P. Cord            Visualized, central
 Insertion:

 Amniotic Fluid
 AFI FV:      Subjectively within normal limits
                                             Larg Pckt:     3.5  cm
Biometry

 BPD:     44.7  mm     G. Age:  19w 4d                CI:         72.9   70 - 86
 OFD:     61.3  mm                                    FL/HC:      19.2   15.9 -

 HC:     170.6  mm     G. Age:  19w 5d        5  %    HC/AC:      1.14   1.06 -

 AC:       149  mm     G. Age:  20w 1d       22  %    FL/BPD:
 FL:      32.7  mm     G. Age:  20w 1d       21  %    FL/AC:      21.9   20 - 24
 HUM:     29.6  mm     G. Age:  19w 5d       16  %
 CER:     21.1  mm     G. Age:  20w 1d       33  %

 Est. FW:     333  gm    0 lb 12 oz      29  %
Gestational Age

 LMP:           20w 6d        Date:  06/25/13                 EDD:   04/01/14
 U/S Today:     19w 6d                                        EDD:   04/08/14
 Best:          20w 6d     Det. By:  LMP  (06/25/13)          EDD:   04/01/14
Anatomy
 Cranium:          Appears normal         Aortic Arch:      Appears normal
 Fetal Cavum:      Appears normal         Ductal Arch:      Appears normal
 Ventricles:       Appears normal         Diaphragm:        Appears normal
 Choroid Plexus:   Appears normal         Stomach:          Appears normal
 Cerebellum:       Appears normal         Abdomen:          Appears normal
 Posterior Fossa:  Appears normal         Abdominal Wall:   Appears nml (cord
                                                            insert, abd wall)
 Nuchal Fold:      Not applicable (>20    Cord Vessels:     Appears normal (3
                   wks GA)                                  vessel cord)
 Face:             Appears normal         Kidneys:          Appear normal
                   (orbits and profile)
 Lips:             Appears normal         Bladder:          Appears normal
 Heart:            Appears normal         Spine:            Appears normal
                   (4CH, axis, and
                   situs)
 RVOT:             Appears normal         Lower             Appears normal
                                          Extremities:
 LVOT:             Appears normal         Upper             Appears normal
                                          Extremities:

 Other:  Fetus appears to be a female. Heels and 5th digit visualized. Nasal
         bone visualized.
Targeted Anatomy

 Fetal Central Nervous System
 Cisterna Magna:
Cervix Uterus Adnexa

 Cervical Length:    4.2      cm

 Cervix:       Normal appearance by transabdominal scan.
 Left Ovary:    Within normal limits.
 Right Ovary:   Within normal limits.

 Adnexa:     No abnormality visualized.
Impression

 IUP at 20+6 weeks
 Normal detailed fetal anatomy
 Markers of aneuploidy: none
 Normal amniotic fluid volume
 Measurements consistent with LMP dating (7 days)

 The US findings were shared with Ms. Aarthi. The
 implications of a normal detailed US on her T21 risk were
 discussed in detail. All of her prenatal testing and pregnancy
 management options were reviewed. After careful
 consideration, she declined amniocentesis but opted for cell
 free DNA screening (Panorama).

 We will forward the results to you as soon as they are
 available.
Recommendations

 Follow-up as clinically indicated
 questions or concerns.

## 2014-04-05 ENCOUNTER — Ambulatory Visit (INDEPENDENT_AMBULATORY_CARE_PROVIDER_SITE_OTHER): Payer: Medicaid Other | Admitting: *Deleted

## 2014-04-05 VITALS — BP 110/73 | HR 79

## 2014-04-05 DIAGNOSIS — O480 Post-term pregnancy: Secondary | ICD-10-CM

## 2014-04-05 LAB — US OB FOLLOW UP

## 2014-04-06 ENCOUNTER — Telehealth (HOSPITAL_COMMUNITY): Payer: Self-pay | Admitting: *Deleted

## 2014-04-06 NOTE — Telephone Encounter (Signed)
Preadmission screen  

## 2014-04-08 ENCOUNTER — Inpatient Hospital Stay (HOSPITAL_COMMUNITY): Payer: Medicaid Other | Admitting: Anesthesiology

## 2014-04-08 ENCOUNTER — Inpatient Hospital Stay (HOSPITAL_COMMUNITY)
Admission: RE | Admit: 2014-04-08 | Discharge: 2014-04-10 | DRG: 775 | Disposition: A | Discharge status: Home / Self Care | Payer: Medicaid Other | Source: Ambulatory Visit | Attending: Obstetrics & Gynecology | Admitting: Obstetrics & Gynecology

## 2014-04-08 ENCOUNTER — Encounter (HOSPITAL_COMMUNITY): Payer: Medicaid Other | Admitting: Anesthesiology

## 2014-04-08 VITALS — BP 108/71 | HR 69 | Temp 97.7°F | Resp 17 | Ht 65.0 in | Wt 155.0 lb

## 2014-04-08 DIAGNOSIS — O480 Post-term pregnancy: Principal | ICD-10-CM | POA: Diagnosis present

## 2014-04-08 DIAGNOSIS — Z3490 Encounter for supervision of normal pregnancy, unspecified, unspecified trimester: Secondary | ICD-10-CM

## 2014-04-08 DIAGNOSIS — O26619 Liver and biliary tract disorders in pregnancy, unspecified trimester: Secondary | ICD-10-CM | POA: Diagnosis present

## 2014-04-08 DIAGNOSIS — K838 Other specified diseases of biliary tract: Secondary | ICD-10-CM | POA: Diagnosis present

## 2014-04-08 LAB — CBC
HCT: 37.3 % (ref 36.0–46.0)
Hemoglobin: 12.6 g/dL (ref 12.0–15.0)
MCH: 29.6 pg (ref 26.0–34.0)
MCHC: 33.8 g/dL (ref 30.0–36.0)
MCV: 87.6 fL (ref 78.0–100.0)
PLATELETS: 199 10*3/uL (ref 150–400)
RBC: 4.26 MIL/uL (ref 3.87–5.11)
RDW: 13.2 % (ref 11.5–15.5)
WBC: 9.4 10*3/uL (ref 4.0–10.5)

## 2014-04-08 LAB — RPR

## 2014-04-08 LAB — TYPE AND SCREEN
ABO/RH(D): O POS
Antibody Screen: NEGATIVE

## 2014-04-08 LAB — ABO/RH: ABO/RH(D): O POS

## 2014-04-08 MED ORDER — DIPHENHYDRAMINE HCL 50 MG/ML IJ SOLN: 12.5000 mg | INTRAMUSCULAR | Status: DC | PRN

## 2014-04-08 MED ORDER — LACTATED RINGERS IV SOLN: 500.0000 mL | INTRAVENOUS | Status: DC | PRN

## 2014-04-08 MED ORDER — OXYTOCIN 40 UNITS IN LACTATED RINGERS INFUSION - SIMPLE MED
1.0000 m[IU]/min | INTRAVENOUS | Status: DC
  Administered 2014-04-09: 2 m[IU]/min via INTRAVENOUS
  Filled 2014-04-08: qty 1000

## 2014-04-08 MED ORDER — IBUPROFEN 600 MG PO TABS: 600.0000 mg | ORAL_TABLET | Freq: Four times a day (QID) | ORAL | Status: DC | PRN

## 2014-04-08 MED ORDER — EPHEDRINE 5 MG/ML INJ
10.0000 mg | INTRAVENOUS | Status: DC | PRN
  Filled 2014-04-08: qty 2

## 2014-04-08 MED ORDER — OXYTOCIN BOLUS FROM INFUSION: 500.0000 mL | INTRAVENOUS | Status: DC

## 2014-04-08 MED ORDER — ONDANSETRON HCL 4 MG/2ML IJ SOLN: 4.0000 mg | Freq: Four times a day (QID) | INTRAMUSCULAR | Status: DC | PRN

## 2014-04-08 MED ORDER — MISOPROSTOL 200 MCG PO TABS
50.0000 ug | ORAL_TABLET | ORAL | Status: DC | PRN
  Administered 2014-04-08 (×2): 50 ug via ORAL
  Filled 2014-04-08 (×2): qty 0.5

## 2014-04-08 MED ORDER — PHENYLEPHRINE 40 MCG/ML (10ML) SYRINGE FOR IV PUSH (FOR BLOOD PRESSURE SUPPORT)
80.0000 ug | PREFILLED_SYRINGE | INTRAVENOUS | Status: DC | PRN
  Filled 2014-04-08: qty 2
  Filled 2014-04-08: qty 10

## 2014-04-08 MED ORDER — OXYTOCIN 40 UNITS IN LACTATED RINGERS INFUSION - SIMPLE MED: 62.5000 mL/h | INTRAVENOUS | Status: DC

## 2014-04-08 MED ORDER — FENTANYL CITRATE 0.05 MG/ML IJ SOLN
50.0000 ug | INTRAMUSCULAR | Status: DC | PRN
  Administered 2014-04-08: 50 ug via INTRAVENOUS
  Filled 2014-04-08: qty 2

## 2014-04-08 MED ORDER — CITRIC ACID-SODIUM CITRATE 334-500 MG/5ML PO SOLN: 30.0000 mL | ORAL | Status: DC | PRN

## 2014-04-08 MED ORDER — FLEET ENEMA 7-19 GM/118ML RE ENEM: 1.0000 | ENEMA | RECTAL | Status: DC | PRN

## 2014-04-08 MED ORDER — PHENYLEPHRINE 40 MCG/ML (10ML) SYRINGE FOR IV PUSH (FOR BLOOD PRESSURE SUPPORT)
80.0000 ug | PREFILLED_SYRINGE | INTRAVENOUS | Status: DC | PRN
  Filled 2014-04-08: qty 2

## 2014-04-08 MED ORDER — TERBUTALINE SULFATE 1 MG/ML IJ SOLN: 0.2500 mg | Freq: Once | INTRAMUSCULAR | Status: AC | PRN

## 2014-04-08 MED ORDER — LACTATED RINGERS IV SOLN: 500.0000 mL | Freq: Once | INTRAVENOUS | Status: DC

## 2014-04-08 MED ORDER — EPHEDRINE 5 MG/ML INJ
10.0000 mg | INTRAVENOUS | Status: DC | PRN
  Filled 2014-04-08: qty 2
  Filled 2014-04-08: qty 4

## 2014-04-08 MED ORDER — OXYCODONE-ACETAMINOPHEN 5-325 MG PO TABS: 1.0000 | ORAL_TABLET | ORAL | Status: DC | PRN

## 2014-04-08 MED ORDER — ACETAMINOPHEN 325 MG PO TABS: 650.0000 mg | ORAL_TABLET | ORAL | Status: DC | PRN

## 2014-04-08 MED ORDER — LIDOCAINE HCL (PF) 1 % IJ SOLN
30.0000 mL | INTRAMUSCULAR | Status: AC | PRN
  Administered 2014-04-08 (×2): 5 mL via SUBCUTANEOUS

## 2014-04-08 MED ORDER — FENTANYL 2.5 MCG/ML BUPIVACAINE 1/10 % EPIDURAL INFUSION (WH - ANES)
14.0000 mL/h | INTRAMUSCULAR | Status: DC | PRN
  Administered 2014-04-08 – 2014-04-09 (×2): 14 mL/h via EPIDURAL
  Filled 2014-04-08 (×2): qty 125

## 2014-04-08 MED ORDER — LACTATED RINGERS IV SOLN
INTRAVENOUS | Status: DC
  Administered 2014-04-08 – 2014-04-09 (×3): via INTRAVENOUS

## 2014-04-08 NOTE — Progress Notes (Signed)
   Gillian ShieldsDanae Abdou is a 33 y.o. G1P0 at 5486w0d  admitted for induction of labor due to Post dates. .  Subjective:  Contractions are getting stronger Objective: Filed Vitals:   04/08/14 1412 04/08/14 1502 04/08/14 1700 04/08/14 1756  BP: 127/78 129/85 134/91 135/83  Pulse: 68 60 58 53  Temp:      TempSrc:      Resp:      Height:      Weight:          FHT:  FHR: 150 bpm, variability: moderate,  accelerations:  Present,  decelerations:  Absent UC:   regular, every 2-3 minutes SVE:   Dilation: 2 Effacement (%): 90 Station: -1 Exam by:: J.Cox, RN Foley inserted by Dr. Jordan LikesSchmitz and inflated with 60cc H20  Labs: Lab Results  Component Value Date   WBC 9.4 04/08/2014   HGB 12.6 04/08/2014   HCT 37.3 04/08/2014   MCV 87.6 04/08/2014   PLT 199 04/08/2014    Assessment / Plan: IOL for postdates, ripening phase Will start pitocin when foley falls out if not in active labor Labor: no Fetal Wellbeing:  Category I Pain Control:  Labor support without medications Anticipated MOD:  NSVD  CRESENZO-DISHMAN,FRANCES 04/08/2014, 7:01 PM

## 2014-04-08 NOTE — Anesthesia Preprocedure Evaluation (Signed)
Anesthesia Evaluation  Patient identified by MRN, date of birth, ID band Patient awake    Reviewed: Allergy & Precautions, H&P , NPO status , Patient's Chart, lab work & pertinent test results  History of Anesthesia Complications Negative for: history of anesthetic complications  Airway Mallampati: II TM Distance: >3 FB Neck ROM: Full    Dental  (+) Teeth Intact   Pulmonary neg pulmonary ROS,          Cardiovascular negative cardio ROS  Rhythm:Regular     Neuro/Psych negative neurological ROS  negative psych ROS   GI/Hepatic negative GI ROS, Neg liver ROS,   Endo/Other  negative endocrine ROS  Renal/GU negative Renal ROS     Musculoskeletal   Abdominal   Peds  Hematology negative hematology ROS (+)   Anesthesia Other Findings   Reproductive/Obstetrics                           Anesthesia Physical Anesthesia Plan  ASA: II  Anesthesia Plan: Epidural   Post-op Pain Management:    Induction:   Airway Management Planned:   Additional Equipment:   Intra-op Plan:   Post-operative Plan:   Informed Consent: I have reviewed the patients History and Physical, chart, labs and discussed the procedure including the risks, benefits and alternatives for the proposed anesthesia with the patient or authorized representative who has indicated his/her understanding and acceptance.   Dental advisory given  Plan Discussed with: Anesthesiologist  Anesthesia Plan Comments:         Anesthesia Quick Evaluation

## 2014-04-08 NOTE — H&P (Signed)
LABOR ADMISSION HISTORY AND PHYSICAL  Nicole Rivas is a 33 y.o. female G1P0 with IUP at 7129w0d presenting for IOL for post dates.    She reports having contractions since last night. They are occurring every 5 minutes on a consistent basis. She denies any LOF or VB. Good FM. Pregnancy hasn't been complicated.   [facility]   Prenatal History/Complications:  Past Medical History: No past medical history on file.  Past Surgical History: Past Surgical History  Procedure Laterality Date  . Breast enhancement surgery  2011    Obstetrical History: OB History   Grav Para Term Preterm Abortions TAB SAB Ect Mult Living   1              Social History: History   Social History  . Marital Status: Married    Spouse Name: N/A    Number of Children: N/A  . Years of Education: N/A   Social History Main Topics  . Smoking status: Never Smoker   . Smokeless tobacco: Never Used  . Alcohol Use: No  . Drug Use: No  . Sexual Activity: Yes   Other Topics Concern  . Not on file   Social History Narrative  . No narrative on file    Family History: No family history on file.  Allergies: No Known Allergies  Prescriptions prior to admission  Medication Sig Dispense Refill  . Prenatal Vit-Fe Fumarate-FA (PRENATAL VITAMINS PLUS) 27-1 MG TABS Take 1 tablet by mouth daily.      . Ranitidine HCl (ZANTAC PO) Take 1 tablet by mouth daily as needed (heartburn).         Review of Systems   All systems reviewed and negative except as stated in HPI  Blood pressure 110/81, pulse 64, height 5\' 5"  (1.651 m), weight 70.308 kg (155 lb), last menstrual period 06/25/2013. General appearance: alert, cooperative and no distress Abdomen: soft, non-tender; bowel sounds normal Extremities: Homans sign is negative, no sign of DVT Presentation: cephalic Fetal monitoringBaseline: 125 bpm, Variability: Good {> 6 bpm), Accelerations: Reactive and Decelerations: Absent Uterine  activityFrequency: Every 10 minutes     Prenatal labs: ABO, Rh: --/--/O POS (06/25 0735) Antibody: NEG (06/25 0735) Rubella:   RPR: NON REAC (04/01 1139)  HBsAg: NEGATIVE (11/14 0918)  HIV: NON REACTIVE (04/01 1139)  GBS: Negative (05/22 0000)  1 hr Glucola 153   [phone],165,73 (Barely passed) Genetic screening 1:20 for downs syndrome Anatomy US normal  1:20 for downs syndrome - nml ant scan  >>  Normal panorama  Prenatal Transfer Tool  Maternal Diabetes: No Genetic Screening: 1:20 for downs syndrome ->>  Normal panorama Maternal Ultrasounds/Referrals: Normal Fetal Ultrasounds or other Referrals:  MFM Maternal Substance Abuse:  No Significant Maternal Medications:  Meds include: Zantac Significant Maternal Lab Results: Lab values include: Group B Strep negative  Clinic  LR  Genetic Screen  First Screen +downs 1:20  Anatomic US  WNL - no markers for downs  Glucose Screen  153   [phone],176,165,73 (Barely passed)  GBS Negative  Feeding Preference  Breast  Contraception  undecided  Circumcision   Pap  Negative; GC/CT neg x 2   UDS - negative Flu shot 09/23/13 TDap:  03/11/14   Results for orders placed during the hospital encounter of 04/08/14 (from the past 24 hour(s))  CBC   Collection Time    04/08/14  7:35 AM      Result Value Ref Range   WBC 9.4  4.0 -  10.5 K/uL   RBC 4.26  3.87 - 5.11 MIL/uL   Hemoglobin 12.6  12.0 - 15.0 g/dL   HCT 95.637.3  21.336.0 - 08.646.0 %   MCV 87.6  78.0 - 100.0 fL   MCH 29.6  26.0 - 34.0 pg   MCHC 33.8  30.0 - 36.0 g/dL   RDW 57.813.2  46.911.5 - 62.915.5 %   Platelets 199  150 - 400 K/uL  TYPE AND SCREEN   Collection Time    04/08/14  7:35 AM      Result Value Ref Range   ABO/RH(D) O POS     Antibody Screen NEG     Sample Expiration 04/11/2014      Assessment: Nicole Rivas is a 33 y.o. G1P0 at 728w0d here for IOL for post dates.     #Labor: cytotec PO then FB  #Pain: Unsure. Deciding between IV pain and epidural  #FWB: Cat 1  #ID:  GBS neg   #MOF: Breastfeeding  #MOC: Undecided  #Circ:  Female.   Myra RudeSchmitz, Jeremy E 04/08/2014, 9:06 AM    I have seen and examined this patient and agree with above documentation in the resident's note.   Rulon AbideKeli Beck, M.D. [facility] 04/08/2014 10:59 AM

## 2014-04-08 NOTE — Anesthesia Procedure Notes (Signed)
Epidural Patient location during procedure: OB Start time: 04/08/2014 8:34 PM End time: 04/08/2014 8:47 PM  Staffing Anesthesiologist: MOSER, CHRIS Performed by: anesthesiologist   Preanesthetic Checklist Completed: patient identified, surgical consent, pre-op evaluation, timeout performed, IV checked, risks and benefits discussed and monitors and equipment checked  Epidural Patient position: sitting Prep: site prepped and draped and DuraPrep Patient monitoring: heart rate, cardiac monitor, continuous pulse ox and blood pressure Approach: midline Location: L3-L4 Injection technique: LOR saline  Needle:  Needle type: Tuohy  Needle gauge: 17 G Needle length: 9 cm Needle insertion depth: 5 cm Catheter type: closed end flexible Catheter size: 19 Gauge Catheter at skin depth: 11 cm Test dose: Other  Assessment Events: blood not aspirated, injection not painful, no injection resistance, negative IV test and no paresthesia  Additional Notes H+P and labs checked, risks and benefits discussed with the patient, consent obtained, procedure tolerated well and without complications.  Reason for block:procedure for pain

## 2014-04-08 NOTE — Progress Notes (Signed)
Nicole ShieldsDanae Rivas is a 33 y.o. G1P0 at [redacted]w[redacted]d admitted for induction of labor due to Post dates. .  Subjective: Feeling comfortable. Not feeling much pain or pressure.   Objective: BP 127/78  Pulse 68  Temp(Src) 98.1 F (36.7 C) (Oral)  Resp 20  Ht 5\' 5"  (1.651 m)  Wt 70.308 kg (155 lb)  BMI 25.79 kg/m2  LMP 06/25/2013      FHT:  FHR: 135 bpm, variability: moderate,  accelerations:  Present,  decelerations:  Absent UC:   regular, every 3-4 minutes SVE:   Dilation: 1.5 Effacement (%): 80 Station: -1 Exam by:: J.Cox, RN  Labs: Lab Results  Component Value Date   WBC 9.4 04/08/2014   HGB 12.6 04/08/2014   HCT 37.3 04/08/2014   MCV 87.6 04/08/2014   PLT 199 04/08/2014    Assessment / Plan: IOL for post dates.   Labor: continue cytotec. SROM at 1403 Fetal Wellbeing:  Category I Pain Control:  Labor without medications currenty  I/D:  n/a Anticipated MOD:  NSVD  Myra RudeSchmitz, Jeremy E 04/08/2014, 2:57 PM

## 2014-04-08 NOTE — Progress Notes (Signed)
   Nicole ShieldsDanae Rivas is a 33 y.o. G1P0 at 511w0d  admitted for induction of labor due to Post dates.   Subjective: Comfortable with epidural  Objective: Filed Vitals:   04/08/14 2114 04/08/14 2115 04/08/14 2130 04/08/14 2200  BP:  107/68 107/66 91/71  Pulse: 66  62 67  Temp:   97.4 F (36.3 C)   TempSrc:   Oral   Resp:  20 18 20   Height:      Weight:      SpO2: 98%         FHT:  FHR: 150 bpm, variability: moderate,  accelerations:  Present,  decelerations:  Absent UC:   regular, every 2-3 minutes SVE:   Dilation: 5 Effacement (%): 90 Station: 0;-1 Exam by:: F. Morris, RNC Foley fell out after 10 minutes! Leaking clear fluid   Labs: Lab Results  Component Value Date   WBC 9.4 04/08/2014   HGB 12.6 04/08/2014   HCT 37.3 04/08/2014   MCV 87.6 04/08/2014   PLT 199 04/08/2014    Assessment / Plan: Induction of labor due to postterm,  progressing well on pitocin  Labor: Progressing normally Fetal Wellbeing:  Category I Pain Control:  Epidural Anticipated MOD:  NSVD  CRESENZO-DISHMAN,FRANCES 04/08/2014, 10:39 PM

## 2014-04-09 ENCOUNTER — Encounter (HOSPITAL_COMMUNITY): Payer: Self-pay

## 2014-04-09 DIAGNOSIS — O480 Post-term pregnancy: Secondary | ICD-10-CM | POA: Diagnosis not present

## 2014-04-09 DIAGNOSIS — O26619 Liver and biliary tract disorders in pregnancy, unspecified trimester: Secondary | ICD-10-CM | POA: Diagnosis not present

## 2014-04-09 DIAGNOSIS — K838 Other specified diseases of biliary tract: Secondary | ICD-10-CM | POA: Diagnosis not present

## 2014-04-09 MED ORDER — MEASLES, MUMPS & RUBELLA VAC ~~LOC~~ INJ
0.5000 mL | INJECTION | Freq: Once | SUBCUTANEOUS | Status: DC
  Filled 2014-04-09: qty 0.5

## 2014-04-09 MED ORDER — LIDOCAINE HCL (PF) 1 % IJ SOLN
INTRAMUSCULAR | Status: AC
  Filled 2014-04-09: qty 30

## 2014-04-09 MED ORDER — BENZOCAINE-MENTHOL 20-0.5 % EX AERO: 1.0000 "application " | INHALATION_SPRAY | CUTANEOUS | Status: DC | PRN

## 2014-04-09 MED ORDER — OXYTOCIN 40 UNITS IN LACTATED RINGERS INFUSION - SIMPLE MED: 62.5000 mL/h | INTRAVENOUS | Status: DC | PRN

## 2014-04-09 MED ORDER — TETANUS-DIPHTH-ACELL PERTUSSIS 5-2.5-18.5 LF-MCG/0.5 IM SUSP: 0.5000 mL | Freq: Once | INTRAMUSCULAR | Status: DC

## 2014-04-09 MED ORDER — ZOLPIDEM TARTRATE 5 MG PO TABS: 5.0000 mg | ORAL_TABLET | Freq: Every evening | ORAL | Status: DC | PRN

## 2014-04-09 MED ORDER — DIPHENHYDRAMINE HCL 25 MG PO CAPS: 25.0000 mg | ORAL_CAPSULE | Freq: Four times a day (QID) | ORAL | Status: DC | PRN

## 2014-04-09 MED ORDER — ONDANSETRON HCL 4 MG/2ML IJ SOLN: 4.0000 mg | INTRAMUSCULAR | Status: DC | PRN

## 2014-04-09 MED ORDER — FLEET ENEMA 7-19 GM/118ML RE ENEM: 1.0000 | ENEMA | Freq: Every day | RECTAL | Status: DC | PRN

## 2014-04-09 MED ORDER — SIMETHICONE 80 MG PO CHEW: 80.0000 mg | CHEWABLE_TABLET | ORAL | Status: DC | PRN

## 2014-04-09 MED ORDER — FERROUS SULFATE 325 (65 FE) MG PO TABS
325.0000 mg | ORAL_TABLET | Freq: Two times a day (BID) | ORAL | Status: DC
  Administered 2014-04-09 – 2014-04-10 (×2): 325 mg via ORAL
  Filled 2014-04-09 (×2): qty 1

## 2014-04-09 MED ORDER — PRENATAL MULTIVITAMIN CH: 1.0000 | ORAL_TABLET | Freq: Every day | ORAL | Status: DC

## 2014-04-09 MED ORDER — METHYLERGONOVINE MALEATE 0.2 MG/ML IJ SOLN: 0.2000 mg | INTRAMUSCULAR | Status: DC | PRN

## 2014-04-09 MED ORDER — ONDANSETRON HCL 4 MG PO TABS: 4.0000 mg | ORAL_TABLET | ORAL | Status: DC | PRN

## 2014-04-09 MED ORDER — IBUPROFEN 600 MG PO TABS
600.0000 mg | ORAL_TABLET | Freq: Four times a day (QID) | ORAL | Status: DC
  Administered 2014-04-09 – 2014-04-10 (×5): 600 mg via ORAL
  Filled 2014-04-09 (×5): qty 1

## 2014-04-09 MED ORDER — WITCH HAZEL-GLYCERIN EX PADS: 1.0000 "application " | MEDICATED_PAD | CUTANEOUS | Status: DC | PRN

## 2014-04-09 MED ORDER — LANOLIN HYDROUS EX OINT: TOPICAL_OINTMENT | CUTANEOUS | Status: DC | PRN

## 2014-04-09 MED ORDER — METHYLERGONOVINE MALEATE 0.2 MG PO TABS: 0.2000 mg | ORAL_TABLET | ORAL | Status: DC | PRN

## 2014-04-09 MED ORDER — DIBUCAINE 1 % RE OINT: 1.0000 "application " | TOPICAL_OINTMENT | RECTAL | Status: DC | PRN

## 2014-04-09 MED ORDER — BISACODYL 10 MG RE SUPP: 10.0000 mg | Freq: Every day | RECTAL | Status: DC | PRN

## 2014-04-09 MED ORDER — SENNOSIDES-DOCUSATE SODIUM 8.6-50 MG PO TABS
2.0000 | ORAL_TABLET | ORAL | Status: DC
Start: 2014-04-10 — End: 2014-04-10
  Administered 2014-04-10: 2 via ORAL
  Filled 2014-04-09: qty 2

## 2014-04-09 MED ORDER — OXYCODONE-ACETAMINOPHEN 5-325 MG PO TABS: 1.0000 | ORAL_TABLET | ORAL | Status: DC | PRN

## 2014-04-09 NOTE — Lactation Note (Signed)
This note was copied from the chart of Nicole Rivas. Lactation Consultation Note     Initial consult with this mom and baby, now 6 hours old and full term Mom has done well with breast feeding so far, and baby has already voided and tooled. I assisted mom with latching in cross cradle hold, and showed mom what a deep latch looks and feel like. Baby suckled well with strong, rhythmic suckles for about 10 minutes, and then unlatched. Teaching done from the Baby and Me booklet on brest feeding, and lactation services and community reviewed with mom. Mom knows to call for lactation assistance as needed.  Patient Name: Nicole Gillian ShieldsDanae Venters ZOXWR'UToday's Date: 04/09/2014 Reason for consult: Initial assessment   Maternal Data Formula Feeding for Exclusion: No Infant to breast within first hour of birth: Yes Has patient been taught Hand Expression?: Yes Does the patient have breastfeeding experience prior to this delivery?: No  Feeding Feeding Type: Breast Fed Length of feed: 10 min  LATCH Score/Interventions Latch: Grasps breast easily, tongue down, lips flanged, rhythmical sucking.  Audible Swallowing: A few with stimulation  Type of Nipple: Everted at rest and after stimulation  Comfort (Breast/Nipple): Soft / non-tender     Hold (Positioning): Assistance needed to correctly position infant at breast and maintain latch. Intervention(s): Breastfeeding basics reviewed;Support Pillows;Position options;Skin to skin  LATCH Score: 8  Lactation Tools Discussed/Used     Consult Status Consult Status: Follow-up Date: 04/10/14 Follow-up type: In-patient    Alfred LevinsLee, Hollye Pritt Anne 04/09/2014, 1:07 PM

## 2014-04-09 NOTE — Progress Notes (Signed)
   Nicole ShieldsDanae Rivas is a 33 y.o. G1P0 at 3743w1d  admitted for induction of labor due to Post dates.  Subjective: Comfortable with epidural  Objective: Filed Vitals:   04/09/14 0000 04/09/14 0030 04/09/14 0100 04/09/14 0130  BP: 112/76 100/67 118/76 113/73  Pulse: 64 80 65 71  Temp:    98.3 F (36.8 C)  TempSrc:    Oral  Resp: 18 20 20 16   Height:      Weight:      SpO2:          FHT:  FHR: 130 bpm, variability: moderate,  accelerations:  Present,  decelerations:  Absent UC:   irregular, every 2-6 minutes SVE:   Dilation: 8 Effacement (%): 90 Station: -1 Exam by:: F. Cresenzo-Dishman, CNM   Labs: Lab Results  Component Value Date   WBC 9.4 04/08/2014   HGB 12.6 04/08/2014   HCT 37.3 04/08/2014   MCV 87.6 04/08/2014   PLT 199 04/08/2014    Assessment / Plan: IOL for postdates, progressing but contractions have spaced out significantly in the past 2 hours.  WIll augment with pitocin  Labor: Progressing normally Fetal Wellbeing:  Category I Pain Control:  Epidural Anticipated MOD:  NSVD  CRESENZO-DISHMAN,Nicole Rivas 04/09/2014, 1:34 AM

## 2014-04-09 NOTE — Progress Notes (Addendum)
   Nicole ShieldsDanae Rivas is a 33 y.o. G1P0 at 734w1d  admitted for induction of labor due to cholestasis.  Subjective:  Mild pressure Objective: Filed Vitals:   04/09/14 0300 04/09/14 0330 04/09/14 0400 04/09/14 0430  BP: 124/88 106/76 139/82 126/84  Pulse: 85 82 60 66  Temp:  98.1 F (36.7 C)    TempSrc:  Oral    Resp: 20 18 18 20   Height:      Weight:      SpO2:       Total I/O In: -  Out: 550 [Urine:550]  FHT:  FHR: 130 bpm, variability: moderate,  accelerations:  Present,  decelerations:  Absent UC:   irregular, every 2-4 minutes SVE:   Dilation: 10 Effacement (%): 100 Station: +2 Exam by:: F. Morris, RNC Pitocin @ 6 mu/min  Labs: Lab Results  Component Value Date   WBC 9.4 04/08/2014   HGB 12.6 04/08/2014   HCT 37.3 04/08/2014   MCV 87.6 04/08/2014   PLT 199 04/08/2014    Assessment / Plan: Induction of labor due to cholestasis,  progressing well on pitocin Labor down until lots of pressure Labor: Progressing normally Fetal Wellbeing:  Category I Pain Control:  epidural Anticipated MOD:  NSVD  CRESENZO-DISHMAN,FRANCES 04/09/2014, 4:54 AM

## 2014-04-10 MED ORDER — IBUPROFEN 600 MG PO TABS: 600.0000 mg | ORAL_TABLET | Freq: Four times a day (QID) | ORAL | Status: AC

## 2014-04-10 NOTE — Discharge Instructions (Signed)

## 2014-04-10 NOTE — Lactation Note (Signed)
This note was copied from the chart of Girl Gillian ShieldsDanae Mazor. Lactation Consultation Note  Mother states baby recently breastfeed for more than 30 min and is now sleeping. Mother states she knows how to hand express. Reviewed waking techniques, engorgement care and monitoring voids/stools. Mom encouraged to feed baby 8-12 times/24 hours and with feeding cues.  Denies other problems or questions.   Patient Name: Girl Gillian ShieldsDanae Chimenti ZOXWR'UToday's Date: 04/10/2014 Reason for consult: Follow-up assessment   Maternal Data    Feeding Feeding Type: Breast Fed Length of feed: 35 min  LATCH Score/Interventions Latch: Grasps breast easily, tongue down, lips flanged, rhythmical sucking.  Audible Swallowing: A few with stimulation  Type of Nipple: Everted at rest and after stimulation  Comfort (Breast/Nipple): Soft / non-tender     Hold (Positioning): No assistance needed to correctly position infant at breast.  LATCH Score: 9  Lactation Tools Discussed/Used     Consult Status Consult Status: PRN Date: 04/11/14 Follow-up type: In-patient    Dahlia ByesBerkelhammer, Brandy Zuba [facility] 04/10/2014, 1:22 PM

## 2014-04-10 NOTE — Discharge Summary (Signed)
Obstetric Discharge Summary Reason for Admission: induction of labor for postdates Prenatal Procedures: none Intrapartum Procedures: spontaneous vaginal delivery Postpartum Procedures: none Complications-Operative and Postpartum: 2nd degree perineal laceration Hemoglobin  Date Value Ref Range Status  04/08/2014 12.6  12.0 - 15.0 g/dL Final     HCT  Date Value Ref Range Status  04/08/2014 37.3  36.0 - 46.0 % Final   Ms Nicole Rivas is a 33yo G1 admitted in the AM of 6/25 for IOL due to postdates @ 41wks. Her cx was first ripened with cytotec followed by a foley bulb and then Pitocin. She progressed to SVD by the following morning. By PPD#1 she is doing well and is deemed to have received the full benefit of her hospital stay. She is breastfeeding and desires Nexplanon for contraception.  Physical Exam:  General: alert, cooperative and no distress Heart: RRR Lungs: nl effort Lochia: appropriate Uterine Fundus: firm DVT Evaluation: No evidence of DVT seen on physical exam.  Discharge Diagnoses: Term Pregnancy-delivered  Discharge Information: Date: 04/10/2014 Activity: pelvic rest Diet: routine Medications: PNV and Ibuprofen Condition: stable Instructions: refer to practice specific booklet Discharge to: home Follow-up Information   [facility] Schedule an appointment as soon as possible for a visit in 4 weeks. (For your postpartum appointment.)    Contact information:   [address] [place] [id number] [phone]      Newborn Data: Live born female  Birth Weight: 7 lb 4 oz (3289 g) APGAR: 9, 9  Home with mother.  Cam HaiSHAW, KIMBERLY CNM 04/10/2014, 7:33 AM

## 2014-04-11 ENCOUNTER — Ambulatory Visit: Payer: Self-pay

## 2014-04-11 NOTE — Lactation Note (Signed)
This note was copied from the chart of Nicole Gillian ShieldsDanae Gros. Lactation Consultation Note  Patient Name: Nicole Rivas ZOXWR'UToday's Date: 04/11/2014 Reason for consult: Follow-up assessment Per mom the baby has been cluster feeding all night and doesn't seem satisfied. LC assessed ,. Noticed the baby to have dry lips, and to be very fussy , so fussy wouldn't latch. LC had mom try to express milk , small drop noted , LC tried the same. With moms permission , LC recommended Giving the baby extra calories and assisting to latch. Gave her an appetizer of formula - Good start. And she started to settle down. Latched with depth and fed for 15 mins with SNS ( 39F feeding tube ) and ingested 17 ml . Sustained a consistent pattern with multiply swallows, Increased with breast compressions. LC reassured parents the breast feeding will work it out . LC noted eventhough mom has implants the areolas are  Compressible . Reviewed sore nipple and engorgement prevention and treatment. Dr. Margo AyeHall aware of the feeing outcome. LC suggested to mom eating breakfast , and getting  A good nap. While the baby is resting and to call with feeding cues.    Maternal Data Has patient been taught Hand Expression?: Yes  Feeding Feeding Type: Formula Length of feed: 20 min  LATCH Score/Interventions Latch: Grasps breast easily, tongue down, lips flanged, rhythmical sucking.  Audible Swallowing: Spontaneous and intermittent Intervention(s): Hand expression;Alternate breast massage  Type of Nipple: Everted at rest and after stimulation  Comfort (Breast/Nipple): Soft / non-tender Intervention(s): Hand pump     Hold (Positioning): Assistance needed to correctly position infant at breast and maintain latch. Intervention(s): Breastfeeding basics reviewed;Support Pillows;Position options;Skin to skin  LATCH Score: 9  Lactation Tools Discussed/Used Tools: Pump Breast pump type: Manual WIC Program: Yes   Consult  Status Consult Status: Follow-up Date: 04/11/14 Follow-up type: In-patient    Kathrin Greathouseorio, Nicole Ann 04/11/2014, 10:04 AM

## 2014-04-11 NOTE — Lactation Note (Addendum)
This note was copied from the chart of Nicole Nicole ShieldsDanae Rivas. Lactation Consultation Note  Patient Name: Nicole Rivas RUEAV'WToday's Date: 04/11/2014 Reason for consult: Follow-up assessment Per mom baby slept after the last feeding for 3 hours, also mom slept 3 hours . Baby waking up and showing feeding cues, much calmer compared to this am at the 1st consult.  LC had mom latch the baby in football ( which she did well to obtain the depth) , grandma added the 66F feeding  Tube and did well inserting as shown earlier and taped on the breast. Baby latched well with depth and ingested 20 ml of formula. LC plan for home - prior to latch , breast massage, hand express, pre-pump with hand pump 8=10 strokes and latch , until milk comes  In supplement with EBM or formula and when breast are fuller may need the SNS to start at then try without. Until milk comes in post pump after 4 feedings  A day for 10 mins and save milk for SNS and once milk comes in when necessary to prevent engorgement release the breast the baby doesn't feed on . [facility] and instructions. And is aware [facility] if [facility] .  [facility]. Mom also aware of [facility] , and to obtain her $30.00.  Reviewed engorgement prevention and tx . Stressed to mom to call if challenges during that time.    Maternal Data Has patient been taught Hand Expression?: Yes  Feeding Feeding Type: Formula Length of feed: 20 min  LATCH Score/Interventions Latch: Grasps breast easily, tongue down, lips flanged, rhythmical sucking.  Audible Swallowing: Spontaneous and intermittent (with SNS )  Type of Nipple: Everted at rest and after stimulation  Comfort (Breast/Nipple): Soft / non-tender     Hold (Positioning): Assistance needed to correctly position infant at breast and maintain latch. (LC assisted Grandna  with place ment of 66F feeding tube ) Intervention(s): Breastfeeding basics reviewed;Support Pillows;Position options;Skin to skin  LATCH Score: 9  Lactation Tools Discussed/Used Tools: Pump;66F feeding tube / Syringe Breast pump type: Double-Electric Breast Pump Pump Review: Setup, frequency, and cleaning;Milk Storage Initiated by:: MAI  Date initiated:: 04/11/14   Consult Status Consult Status: Complete Date: 04/11/14    Kathrin Greathouseorio, Nicole Rivas 04/11/2014, 4:44 PM

## 2014-04-12 NOTE — Progress Notes (Signed)
Post discharge chart review completed.  

## 2014-04-14 NOTE — Progress Notes (Signed)
NST reactive on 04/05/14 

## 2014-04-23 NOTE — Addendum Note (Signed)
Addendum created 04/23/14 2327 by Corky Soxhris Nazair Fortenberry, MD   Modules edited: Notes Section   Notes Section:  File: [id number]; Pend: [id number]

## 2014-04-23 NOTE — Anesthesia Postprocedure Evaluation (Signed)
  Anesthesia Post-op Note  Patient: Nicole Rivas  Procedure(s) Performed: * No procedures listed *  Patient Location: Mother/Baby  Anesthesia Type:Epidural  Level of Consciousness: awake  Airway and Oxygen Therapy: Patient Spontanous Breathing  Post-op Pain: mild  Post-op Assessment: Post-op Vital signs reviewed, Patient's Cardiovascular Status Stable and Respiratory Function Stable  Post-op Vital Signs: Reviewed and stable  Last Vitals: There were no vitals filed for this visit.  Complications: No apparent anesthesia complications

## 2014-05-13 ENCOUNTER — Encounter: Payer: Self-pay | Admitting: Obstetrics & Gynecology

## 2014-05-13 ENCOUNTER — Ambulatory Visit (INDEPENDENT_AMBULATORY_CARE_PROVIDER_SITE_OTHER): Payer: Medicaid Other | Admitting: Obstetrics & Gynecology

## 2014-05-13 DIAGNOSIS — Z30430 Encounter for insertion of intrauterine contraceptive device: Secondary | ICD-10-CM

## 2014-05-13 MED ORDER — LEVONORGESTREL 20 MCG/24HR IU IUD
1.0000 | INTRAUTERINE_SYSTEM | Freq: Once | INTRAUTERINE | Status: AC
  Administered 2014-05-13: 1 via INTRAUTERINE

## 2014-05-13 NOTE — Patient Instructions (Signed)
Return to clinic for any obstetric concerns or go to [facility] for evaluation  

## 2014-05-13 NOTE — Progress Notes (Signed)
    Subjective:     Nicole Rivas is a 33 y.o. [id number] female who presents for a postpartum visit. She is 4 weeks postpartum following a spontaneous vaginal delivery. I have fully reviewed the prenatal and intrapartum course. The delivery was at 1513w1d.  Anesthesia: epidural. Postpartum course has been uncomplicated. Baby's course has been uncomplicated. Baby is feeding by breast. Bleeding no bleeding. Bowel function is normal. Bladder function is normal. Patient is not sexually active. Contraception method is IUD. Postpartum depression screening: negative.  Normal pap and negative HRHPV on 09/23/13.  The following portions of the patient's history were reviewed and updated as appropriate: allergies, current medications, past family history, past medical history, past social history, past surgical history and problem list.  Review of Systems Cardiovascular: negative for CP Gastrointestinal: negative for diarrhea/constipation Genitourinary:negative for dysuria, hematuria Behavioral/Psych: negative  for depressed mood, sad  Objective:    There were no vitals taken for this visit.  General:  alert, cooperative and no distress  Lungs: clear to auscultation bilaterally  Heart:  regular rate and rhythm, S1, S2 normal, no murmur, click, rub or gallop  Abdomen: soft, non-tender; bowel sounds normal; no masses,  no organomegaly   Vulva:  normal  Vagina: normal vagina, no discharge, exudate, lesion, or erythema  Cervix:  no lesions   IUD Insertion Procedure Note Patient identified, informed consent performed.  Discussed risks of irregular bleeding, cramping, infection, malpositioning or misplacement of the IUD outside the uterus which may require further procedure such as laparoscopy. Time out was performed.  Urine pregnancy test negative. Speculum placed in the vagina.  Cervix visualized.  Cleaned with Betadine x 2.  Grasped anteriorly with a single tooth tenaculum.  Uterus sounded to 7 cm.  Mirena  IUD placed per manufacturer's recommendations.  Strings trimmed to 3 cm. Tenaculum was removed, good hemostasis noted.  Patient tolerated procedure well.  Patient was given post-procedure instructions.  She was advised to have backup contraception for one week.  Assessment:   Normal postpartum exam. Pap smear not done at today's visit.   Plan:   1. Contraception: IUD, mirena inserted today 05/13/2014 2. Doing well post partum 3. Follow up in: 4 week for string check or as needed.   Tawni CarnesANDREW WIGHT, MD PGY-2    Attestation of Attending Supervision of Resident: Evaluation and management procedures were performed by the [facility] under my supervision.  I have seen and examined the patient, reviewed the resident's note and chart, and I agree with the management and plan. I was present during IUD placement.   Jaynie CollinsUGONNA  Aidaly Cordner, MD, FACOG Attending Obstetrician & Gynecologist Faculty Practice, [facility] [place]

## 2014-05-14 ENCOUNTER — Encounter: Payer: Self-pay | Admitting: *Deleted

## 2014-05-14 LAB — POCT PREGNANCY, URINE: PREG TEST UR: NEGATIVE

## 2014-06-23 ENCOUNTER — Ambulatory Visit (INDEPENDENT_AMBULATORY_CARE_PROVIDER_SITE_OTHER): Payer: Self-pay | Admitting: Obstetrics and Gynecology

## 2014-06-23 VITALS — BP 101/62 | HR 60 | Temp 98.6°F | Wt 144.5 lb

## 2014-06-23 DIAGNOSIS — Z30431 Encounter for routine checking of intrauterine contraceptive device: Secondary | ICD-10-CM

## 2014-06-23 NOTE — Progress Notes (Signed)
Patient ID: Nicole Rivas, female   DOB: [birth year], 33 y.o.   MRN: [id number] Patient is s/p IUD insertion on 7/30 and is here for IUD check. Patient is without complaints. She denies abnormal bleeding or pain. Patient has not been sexually active yet  GENERAL: Well-developed, well-nourished female in no acute distress.  ABDOMEN: Soft, nontender, nondistended. No organomegaly. PELVIC: Normal external female genitalia. Vagina is pink and rugated.  Normal discharge. Normal appearing cervix. IUD strings visualized at os. Uterus is normal in size. No adnexal mass or tenderness. EXTREMITIES: No cyanosis, clubbing, or edema, 2+ distal pulses.  A/P 33 yo here for IUD check - IUD appears to be in appropriate location - RTC prn

## 2014-08-16 ENCOUNTER — Encounter: Payer: Self-pay | Admitting: Obstetrics & Gynecology

## 2014-09-20 ENCOUNTER — Encounter: Payer: Self-pay | Admitting: Obstetrics & Gynecology

## 2020-01-25 ENCOUNTER — Ambulatory Visit: Payer: Self-pay | Attending: Internal Medicine

## 2020-01-25 DIAGNOSIS — Z20822 Contact with and (suspected) exposure to covid-19: Secondary | ICD-10-CM | POA: Insufficient documentation

## 2020-01-26 LAB — NOVEL CORONAVIRUS, NAA: SARS-CoV-2, NAA: NOT DETECTED

## 2020-01-26 LAB — SARS-COV-2, NAA 2 DAY TAT
